# Patient Record
Sex: Female | Born: 1970 | Race: Black or African American | Hispanic: No | Marital: Married | Smoking: Never smoker
Health system: Southern US, Community
[De-identification: ages and names within clinical notes are randomized; demographics above are authoritative.]

## PROBLEM LIST (undated history)

## (undated) DIAGNOSIS — F329 Major depressive disorder, single episode, unspecified: Secondary | ICD-10-CM

## (undated) DIAGNOSIS — F32A Depression, unspecified: Secondary | ICD-10-CM

## (undated) DIAGNOSIS — F419 Anxiety disorder, unspecified: Secondary | ICD-10-CM

## (undated) DIAGNOSIS — I10 Essential (primary) hypertension: Secondary | ICD-10-CM

## (undated) HISTORY — PX: LAPAROSCOPIC GASTRIC SLEEVE RESECTION: SHX5895

## (undated) HISTORY — PX: TUBAL LIGATION: SHX77

## (undated) HISTORY — PX: OTHER SURGICAL HISTORY: SHX169

## (undated) HISTORY — PX: ABDOMINAL HYSTERECTOMY: SHX81

---

## 2010-11-20 ENCOUNTER — Emergency Department (HOSPITAL_BASED_OUTPATIENT_CLINIC_OR_DEPARTMENT_OTHER)
Admission: EM | Admit: 2010-11-20 | Discharge: 2010-11-20 | Disposition: A | Discharge status: Home / Self Care | Payer: BC Managed Care – PPO | Attending: Emergency Medicine | Admitting: Emergency Medicine

## 2010-11-20 ENCOUNTER — Encounter: Payer: Self-pay | Admitting: *Deleted

## 2010-11-20 ENCOUNTER — Emergency Department (INDEPENDENT_AMBULATORY_CARE_PROVIDER_SITE_OTHER): Payer: BC Managed Care – PPO

## 2010-11-20 DIAGNOSIS — J40 Bronchitis, not specified as acute or chronic: Secondary | ICD-10-CM

## 2010-11-20 DIAGNOSIS — R05 Cough: Secondary | ICD-10-CM

## 2010-11-20 DIAGNOSIS — F411 Generalized anxiety disorder: Secondary | ICD-10-CM | POA: Insufficient documentation

## 2010-11-20 DIAGNOSIS — R0989 Other specified symptoms and signs involving the circulatory and respiratory systems: Secondary | ICD-10-CM

## 2010-11-20 DIAGNOSIS — J3489 Other specified disorders of nose and nasal sinuses: Secondary | ICD-10-CM | POA: Insufficient documentation

## 2010-11-20 DIAGNOSIS — R059 Cough, unspecified: Secondary | ICD-10-CM | POA: Insufficient documentation

## 2010-11-20 DIAGNOSIS — I10 Essential (primary) hypertension: Secondary | ICD-10-CM | POA: Insufficient documentation

## 2010-11-20 HISTORY — DX: Essential (primary) hypertension: I10

## 2010-11-20 HISTORY — DX: Anxiety disorder, unspecified: F41.9

## 2010-11-20 MED ORDER — ALBUTEROL SULFATE HFA 108 (90 BASE) MCG/ACT IN AERS: 1.0000 | INHALATION_SPRAY | Freq: Four times a day (QID) | RESPIRATORY_TRACT | Status: DC | PRN

## 2010-11-20 MED ORDER — PREDNISONE 50 MG PO TABS
60.0000 mg | ORAL_TABLET | Freq: Once | ORAL | Status: AC
  Administered 2010-11-20: 60 mg via ORAL
  Filled 2010-11-20: qty 1

## 2010-11-20 MED ORDER — PREDNISONE 10 MG PO TABS: 20.0000 mg | ORAL_TABLET | Freq: Every day | ORAL | Status: AC

## 2010-11-20 NOTE — ED Provider Notes (Signed)
History     CSN: [id number] Arrival date & time: 11/20/2010  1:45 PM   First MD Initiated Contact with Patient 11/20/10 1352      Chief Complaint  Patient presents with  . Nasal Congestion  . Cough    (Consider location/radiation/quality/duration/timing/severity/associated sxs/prior treatment) Patient is a 40 y.o. female presenting with cough. The history is provided by the patient.  Cough This is a new problem. The current episode started more than 1 week ago. The problem occurs constantly. The problem has not changed since onset.The cough is non-productive. There has been no fever. Pertinent negatives include no chest pain, no chills, no sweats, no ear pain, no headaches, no sore throat, no myalgias, no shortness of breath, no wheezing and no eye redness. She is not a smoker. Her past medical history does not include bronchitis, pneumonia, COPD, emphysema or asthma.   Patient seen by her primary care provider one week ago and treated with an antibiotic Zithromax. This resulted in no changes in her cough.   Past Medical History  Diagnosis Date  . Hypertension   . Anxiety     Past Surgical History  Procedure Date  . C-section   . Tubal ligation     No family history on file.  History  Substance Use Topics  . Smoking status: Former Games developer  . Smokeless tobacco: Not on file  . Alcohol Use: No    OB History    Grav Para Term Preterm Abortions TAB SAB Ect Mult Living                  Review of Systems  Constitutional: Negative for chills.  HENT: Negative for ear pain and sore throat.   Eyes: Negative for redness.  Respiratory: Positive for cough. Negative for shortness of breath and wheezing.   Cardiovascular: Negative for chest pain.  Genitourinary: Negative for dysuria.  Musculoskeletal: Negative for myalgias and back pain.  Neurological: Negative for speech difficulty and headaches.    Allergies  Penicillins  Home Medications   Current Outpatient Rx    Name Route Sig Dispense Refill  . ALPRAZOLAM 0.5 MG PO TABS Oral Take 0.5 mg by mouth at bedtime as needed.      Marland Kitchen VALSARTAN-HYDROCHLOROTHIAZIDE 320-25 MG PO TABS Oral Take 1 tablet by mouth daily.        BP 109/79  Pulse 82  Temp(Src) 98.3 F (36.8 C) (Oral)  Resp 20  Ht 5\' 3"  (1.6 m)  Wt 270 lb (122.471 kg)  BMI 47.83 kg/m2  SpO2 96%  LMP 11/17/2010  Physical Exam  Nursing note and vitals reviewed. Constitutional: She is oriented to person, place, and time. She appears well-developed and well-nourished. No distress.  HENT:  Head: Normocephalic and atraumatic.  Mouth/Throat: Oropharynx is clear and moist.  Eyes: Conjunctivae and EOM are normal. Pupils are equal, round, and reactive to light.  Neck: Normal range of motion. Neck supple.  Cardiovascular: Normal rate, regular rhythm, normal heart sounds and intact distal pulses.   No murmur heard. Pulmonary/Chest: Effort normal and breath sounds normal. She has no wheezes. She has no rales. She exhibits no tenderness.  Abdominal: Soft. Bowel sounds are normal. There is no tenderness.  Musculoskeletal: Normal range of motion. She exhibits no edema and no tenderness.  Lymphadenopathy:    She has no cervical adenopathy.  Neurological: She is alert and oriented to person, place, and time. No cranial nerve deficit. She exhibits normal muscle tone. Coordination normal.  Skin: Skin is  warm. No rash noted. She is not diaphoretic.    ED Course  Procedures (including critical care time)  Labs Reviewed - No data to display Dg Chest 2 View  11/20/2010  *RADIOLOGY REPORT*  Clinical Data: Cough, congestion  CHEST - 2 VIEW  Comparison: None.  Findings: There is some linear scarring , atelectasis, less likely infiltrate in the left upper lobe.  Right lung clear. No effusion. Heart size normal.  Regional bones unremarkable.  IMPRESSION:  Left upper lobe linear scarring, atelectasis, or less likely infiltrate.  Original Report Authenticated By:  Osa Craver, M.D.     Impression: Viral bronchitis   MDM  Results a chest x-ray noted. Likely consistent with pneumonia. She has already had a course of antibiotic. Will treat patient for bronchitis. Suspect viral etiology. Treated with 60 mg prednisone in the emergency department. Syndrome of a continuing dose of prednisone for the next 5 days and albuterol inhaler. Inhalers not for wheezing but for the persistent cough.        Shelda Jakes, MD 11/20/10 [phone]

## 2010-11-20 NOTE — ED Notes (Signed)
Pt reports chest congestion and non-productive cough x 2 weeks- Was seen by PCP this week and put on z-pack- states not feeling any better

## 2012-06-30 DIAGNOSIS — I10 Essential (primary) hypertension: Secondary | ICD-10-CM | POA: Insufficient documentation

## 2015-01-06 DIAGNOSIS — J4520 Mild intermittent asthma, uncomplicated: Secondary | ICD-10-CM | POA: Insufficient documentation

## 2015-01-06 DIAGNOSIS — E669 Obesity, unspecified: Secondary | ICD-10-CM | POA: Insufficient documentation

## 2015-01-06 DIAGNOSIS — R000 Tachycardia, unspecified: Secondary | ICD-10-CM | POA: Insufficient documentation

## 2015-01-06 DIAGNOSIS — D509 Iron deficiency anemia, unspecified: Secondary | ICD-10-CM | POA: Insufficient documentation

## 2015-04-13 DIAGNOSIS — E6609 Other obesity due to excess calories: Secondary | ICD-10-CM | POA: Insufficient documentation

## 2015-05-18 ENCOUNTER — Emergency Department (HOSPITAL_BASED_OUTPATIENT_CLINIC_OR_DEPARTMENT_OTHER)
Admission: EM | Admit: 2015-05-18 | Discharge: 2015-05-18 | Disposition: A | Discharge status: Home / Self Care | Payer: No Typology Code available for payment source | Attending: Emergency Medicine | Admitting: Emergency Medicine

## 2015-05-18 ENCOUNTER — Encounter (HOSPITAL_BASED_OUTPATIENT_CLINIC_OR_DEPARTMENT_OTHER): Payer: Self-pay | Admitting: Emergency Medicine

## 2015-05-18 ENCOUNTER — Emergency Department (HOSPITAL_BASED_OUTPATIENT_CLINIC_OR_DEPARTMENT_OTHER): Payer: No Typology Code available for payment source

## 2015-05-18 DIAGNOSIS — I10 Essential (primary) hypertension: Secondary | ICD-10-CM | POA: Diagnosis not present

## 2015-05-18 DIAGNOSIS — S29001A Unspecified injury of muscle and tendon of front wall of thorax, initial encounter: Secondary | ICD-10-CM | POA: Insufficient documentation

## 2015-05-18 DIAGNOSIS — Y998 Other external cause status: Secondary | ICD-10-CM | POA: Insufficient documentation

## 2015-05-18 DIAGNOSIS — Y92410 Unspecified street and highway as the place of occurrence of the external cause: Secondary | ICD-10-CM | POA: Insufficient documentation

## 2015-05-18 DIAGNOSIS — Y9389 Activity, other specified: Secondary | ICD-10-CM | POA: Diagnosis not present

## 2015-05-18 DIAGNOSIS — S3991XA Unspecified injury of abdomen, initial encounter: Secondary | ICD-10-CM | POA: Insufficient documentation

## 2015-05-18 DIAGNOSIS — Z79899 Other long term (current) drug therapy: Secondary | ICD-10-CM | POA: Insufficient documentation

## 2015-05-18 DIAGNOSIS — S199XXA Unspecified injury of neck, initial encounter: Secondary | ICD-10-CM | POA: Insufficient documentation

## 2015-05-18 DIAGNOSIS — S3993XA Unspecified injury of pelvis, initial encounter: Secondary | ICD-10-CM | POA: Diagnosis not present

## 2015-05-18 DIAGNOSIS — S4991XA Unspecified injury of right shoulder and upper arm, initial encounter: Secondary | ICD-10-CM | POA: Diagnosis not present

## 2015-05-18 DIAGNOSIS — Z87891 Personal history of nicotine dependence: Secondary | ICD-10-CM | POA: Diagnosis not present

## 2015-05-18 DIAGNOSIS — Z880 Allergy status to penicillin: Secondary | ICD-10-CM | POA: Diagnosis not present

## 2015-05-18 DIAGNOSIS — F419 Anxiety disorder, unspecified: Secondary | ICD-10-CM | POA: Insufficient documentation

## 2015-05-18 LAB — CBC WITH DIFFERENTIAL/PLATELET
Basophils Absolute: 0 10*3/uL (ref 0.0–0.1)
Basophils Relative: 0 %
Eosinophils Absolute: 0.1 10*3/uL (ref 0.0–0.7)
Eosinophils Relative: 1 %
HEMATOCRIT: 34.3 % — AB (ref 36.0–46.0)
HEMOGLOBIN: 12.5 g/dL (ref 12.0–15.0)
LYMPHS ABS: 1.7 10*3/uL (ref 0.7–4.0)
LYMPHS PCT: 27 %
MCH: 26.4 pg (ref 26.0–34.0)
MCHC: 36.4 g/dL — ABNORMAL HIGH (ref 30.0–36.0)
MCV: 72.4 fL — AB (ref 78.0–100.0)
Monocytes Absolute: 0.5 10*3/uL (ref 0.1–1.0)
Monocytes Relative: 8 %
NEUTROS ABS: 4.1 10*3/uL (ref 1.7–7.7)
NEUTROS PCT: 64 %
Platelets: 233 10*3/uL (ref 150–400)
RBC: 4.74 MIL/uL (ref 3.87–5.11)
RDW: 15.9 % — ABNORMAL HIGH (ref 11.5–15.5)
WBC: 6.3 10*3/uL (ref 4.0–10.5)

## 2015-05-18 LAB — COMPREHENSIVE METABOLIC PANEL
ALK PHOS: 84 U/L (ref 38–126)
ALT: 9 U/L — AB (ref 14–54)
AST: 12 U/L — AB (ref 15–41)
Albumin: 3.8 g/dL (ref 3.5–5.0)
Anion gap: 12 (ref 5–15)
BUN: 10 mg/dL (ref 6–20)
CALCIUM: 9 mg/dL (ref 8.9–10.3)
CO2: 21 mmol/L — ABNORMAL LOW (ref 22–32)
CREATININE: 0.54 mg/dL (ref 0.44–1.00)
Chloride: 104 mmol/L (ref 101–111)
Glucose, Bld: 110 mg/dL — ABNORMAL HIGH (ref 65–99)
Potassium: 3.4 mmol/L — ABNORMAL LOW (ref 3.5–5.1)
Sodium: 137 mmol/L (ref 135–145)
Total Bilirubin: 0.4 mg/dL (ref 0.3–1.2)
Total Protein: 7.3 g/dL (ref 6.5–8.1)

## 2015-05-18 LAB — URINALYSIS, ROUTINE W REFLEX MICROSCOPIC
Bilirubin Urine: NEGATIVE
Glucose, UA: NEGATIVE mg/dL
Hgb urine dipstick: NEGATIVE
Ketones, ur: NEGATIVE mg/dL
Leukocytes, UA: NEGATIVE
Nitrite: NEGATIVE
Protein, ur: NEGATIVE mg/dL
Specific Gravity, Urine: 1.016 (ref 1.005–1.030)
pH: 6 (ref 5.0–8.0)

## 2015-05-18 LAB — RAPID URINE DRUG SCREEN, HOSP PERFORMED
Amphetamines: NOT DETECTED
BARBITURATES: NOT DETECTED
BENZODIAZEPINES: NOT DETECTED
COCAINE: NOT DETECTED
OPIATES: NOT DETECTED
Tetrahydrocannabinol: NOT DETECTED

## 2015-05-18 LAB — LIPASE, BLOOD: LIPASE: 42 U/L (ref 11–51)

## 2015-05-18 LAB — ETHANOL: Alcohol, Ethyl (B): <5 mg/dL

## 2015-05-18 MED ORDER — IOPAMIDOL (ISOVUE-300) INJECTION 61%
100.0000 mL | Freq: Once | INTRAVENOUS | Status: AC | PRN
  Administered 2015-05-18: 100 mL via INTRAVENOUS

## 2015-05-18 MED ORDER — SODIUM CHLORIDE 0.9 % IV BOLUS (SEPSIS)
1000.0000 mL | Freq: Once | INTRAVENOUS | Status: AC
  Administered 2015-05-18: 1000 mL via INTRAVENOUS

## 2015-05-18 MED ORDER — FENTANYL CITRATE (PF) 100 MCG/2ML IJ SOLN
50.0000 ug | Freq: Once | INTRAMUSCULAR | Status: AC
  Administered 2015-05-18: 50 ug via INTRAVENOUS
  Filled 2015-05-18: qty 2

## 2015-05-18 NOTE — ED Notes (Signed)
Patient transported to CT 
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
## 2015-05-18 NOTE — ED Notes (Signed)
Pt requested a work note. Dr Mora Bellmanni aware. States he has talked with pt about this and she is able to go to work today. Pt made aware.
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
## 2015-06-03 DIAGNOSIS — R9431 Abnormal electrocardiogram [ECG] [EKG]: Secondary | ICD-10-CM | POA: Insufficient documentation

## 2015-06-03 DIAGNOSIS — E559 Vitamin D deficiency, unspecified: Secondary | ICD-10-CM | POA: Insufficient documentation

## 2015-06-12 DIAGNOSIS — R002 Palpitations: Secondary | ICD-10-CM | POA: Insufficient documentation

## 2015-07-09 DIAGNOSIS — M2550 Pain in unspecified joint: Secondary | ICD-10-CM | POA: Insufficient documentation

## 2015-12-16 DIAGNOSIS — G8929 Other chronic pain: Secondary | ICD-10-CM | POA: Insufficient documentation

## 2015-12-16 DIAGNOSIS — M5432 Sciatica, left side: Secondary | ICD-10-CM | POA: Insufficient documentation

## 2016-03-10 ENCOUNTER — Ambulatory Visit: Payer: BC Managed Care – PPO | Admitting: Dietician

## 2017-01-25 DIAGNOSIS — N12 Tubulo-interstitial nephritis, not specified as acute or chronic: Secondary | ICD-10-CM | POA: Insufficient documentation

## 2017-01-30 DIAGNOSIS — A4150 Gram-negative sepsis, unspecified: Secondary | ICD-10-CM | POA: Insufficient documentation

## 2017-04-27 ENCOUNTER — Encounter (HOSPITAL_BASED_OUTPATIENT_CLINIC_OR_DEPARTMENT_OTHER): Payer: Self-pay | Admitting: *Deleted

## 2017-04-27 ENCOUNTER — Other Ambulatory Visit: Payer: Self-pay

## 2017-04-27 ENCOUNTER — Emergency Department (HOSPITAL_BASED_OUTPATIENT_CLINIC_OR_DEPARTMENT_OTHER)
Admission: EM | Admit: 2017-04-27 | Discharge: 2017-04-27 | Disposition: A | Discharge status: Home / Self Care | Payer: BC Managed Care – PPO | Attending: Emergency Medicine | Admitting: Emergency Medicine

## 2017-04-27 DIAGNOSIS — Z79899 Other long term (current) drug therapy: Secondary | ICD-10-CM | POA: Diagnosis not present

## 2017-04-27 DIAGNOSIS — Z87891 Personal history of nicotine dependence: Secondary | ICD-10-CM | POA: Insufficient documentation

## 2017-04-27 DIAGNOSIS — I10 Essential (primary) hypertension: Secondary | ICD-10-CM | POA: Diagnosis not present

## 2017-04-27 DIAGNOSIS — J069 Acute upper respiratory infection, unspecified: Secondary | ICD-10-CM | POA: Insufficient documentation

## 2017-04-27 DIAGNOSIS — R51 Headache: Secondary | ICD-10-CM | POA: Diagnosis present

## 2017-04-27 MED ORDER — FLUTICASONE PROPIONATE 50 MCG/ACT NA SUSP: 1.0000 | Freq: Every day | NASAL | 2 refills | Status: DC

## 2017-04-27 MED ORDER — IBUPROFEN 800 MG PO TABS: 800.0000 mg | ORAL_TABLET | Freq: Three times a day (TID) | ORAL | 0 refills | Status: DC | PRN
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
## 2017-04-27 NOTE — ED Provider Notes (Signed)
[facility] EMERGENCY DEPARTMENT Provider Note   CSN: [id number] Arrival date & time: 04/27/17  2231     History   Chief Complaint Chief Complaint  Patient presents with  . Hypertension  . Facial Pain    HPI Kaylee Smith is a 47 y.o. female with a hx of HTN and anxiety who presents to the ED with concern for sinus infection and hypertension today. Patient states she developed congestion, rhinorrhea, bilateral ear pressure, sinus pressure/pain, and sore throat today. States she tried sinus alkaseltzer without relief. No other specific alleviating/aggravating factors. Patient states she also had concern for possible elevated blood pressure after eating barbeque today, relays she felt a bit nauseous afterwards and thought her blood pressure may be high. She has been taking her antihypertensive medication as prescribed. Denies change in vision, numbness weakness, headaches, chest pain, or dyspnea. She also denies any fever or chills.   HPI  Past Medical History:  Diagnosis Date  . Anxiety   . Hypertension     There are no active problems to display for this patient.   Past Surgical History:  Procedure Laterality Date  . ABDOMINAL HYSTERECTOMY    . c-section    . TUBAL LIGATION       OB History   None      Home Medications    Prior to Admission medications   Medication Sig Start Date End Date Taking? Authorizing Provider  albuterol (PROVENTIL HFA;VENTOLIN HFA) 108 (90 BASE) MCG/ACT inhaler Inhale 1-2 puffs into the lungs every 6 (six) hours as needed for wheezing. 11/20/10 11/20/11  Vanetta MuldersZackowski, Scott, MD  ALPRAZolam Prudy Feeler(XANAX) 0.5 MG tablet Take 0.5 mg by mouth at bedtime as needed.      [provider]  clonazePAM (KLONOPIN) 0.5 MG tablet Take 0.5 mg by mouth 2 (two) times daily as needed for anxiety.    [provider]  valsartan-hydrochlorothiazide (DIOVAN-HCT) 320-25 MG per tablet Take 1 tablet by mouth daily.      [provider]     Family History History reviewed. No pertinent family history.  Social History Social History   Tobacco Use  . Smoking status: Former Games developermoker  . Smokeless tobacco: Never Used  Substance Use Topics  . Alcohol use: No  . Drug use: No     Allergies   Penicillins   Review of Systems Review of Systems  Constitutional: Negative for chills and fever.  HENT: Positive for congestion, ear pain, sinus pressure, sinus pain and sore throat. Negative for tinnitus, trouble swallowing and voice change.   Eyes: Negative for visual disturbance.  Respiratory: Negative for cough and shortness of breath.   Cardiovascular: Negative for chest pain.  Gastrointestinal: Positive for nausea (resolved at present). Negative for abdominal pain.  Neurological: Negative for dizziness, syncope, facial asymmetry, speech difficulty, weakness, light-headedness and numbness.     Physical Exam Updated Vital Signs BP (!) 144/100 (BP Location: Right Arm)   Pulse 92   Temp 98.1 F (36.7 C) (Oral)   Resp 20   Ht 5\' 5"  (1.651 m)   Wt 126.1 kg (278 lb)   LMP 11/17/2010   SpO2 97%   BMI 46.26 kg/m   Physical Exam  Constitutional: She appears well-developed and well-nourished.  Non-toxic appearance. No distress.  HENT:  Head: Normocephalic and atraumatic.  Right Ear: Tympanic membrane is not perforated, not erythematous, not retracted and not bulging.  Left Ear: Tympanic membrane is not perforated, not erythematous, not retracted and not bulging.  Nose:  Mucosal edema (congestion) present. Right sinus exhibits maxillary sinus tenderness (mild) and frontal sinus tenderness (mild). Left sinus exhibits maxillary sinus tenderness (mild) and frontal sinus tenderness (mild).  Mouth/Throat: Uvula is midline and oropharynx is clear and moist. No oropharyngeal exudate or posterior oropharyngeal erythema.  Eyes: Pupils are equal, round, and reactive to light. Conjunctivae and EOM are normal. Right eye exhibits no  discharge. Left eye exhibits no discharge.  Neck: Normal range of motion. Neck supple.  Cardiovascular: Normal rate and regular rhythm.  No murmur heard. Pulses:      Radial pulses are 2+ on the right side, and 2+ on the left side.  Pulmonary/Chest: Breath sounds normal. No respiratory distress. She has no wheezes. She has no rales.  Abdominal: Soft. She exhibits no distension. There is no tenderness.  Lymphadenopathy:    She has no cervical adenopathy.  Neurological: She is alert.  Clear Speech. No facial droop. CN III-XII grossly intact. Sensation grossly intact to bilateral upper/lower extremities. 5/5 grip strength. 5/5 strength with plantar/dorsiflexion bilaterally. Steady gait.   Skin: Skin is warm and dry. No rash noted.  Psychiatric: She has a normal mood and affect. Her behavior is normal.  Nursing note and vitals reviewed.   ED Treatments / Results  Labs (all labs ordered are listed, but only abnormal results are displayed) Labs Reviewed - No data to display  EKG None  Radiology No results found.  Procedures Procedures (including critical care time)  Medications Ordered in ED Medications - No data to display   Initial Impression / Assessment and Plan / ED Course  I have reviewed the triage vital signs and the nursing notes.  Pertinent labs & imaging results that were available during my care of the patient were reviewed by me and considered in my medical decision making (see chart for details).   Patient presents with sxs consistent with viral URI. Patient is nontoxic appearing, in no apparent distress. Vitals are WNL with the exception of her elevated blood pressure at 144/100- she denies headache, change in vision, numbness, weakness, chest pain, dyspnea, dizziness, or lightheadedness therefore doubt hypertensive emergency, discussed with patient to continue her antihypertensive medication as prescribed with primary care recheck within 1 week for re-evaluation and  possible adjustment in her blood pressure medication. In regards to her concern for sinus infection- given patient is afebrile and her sxs have been ongoing < 7 days doubt acute bacterial sinusitis at this time. Her lungs are CTA, doubt pneumonia. Centor score 0 doubt strep pharyngitis. No evidence of AOM on exam. At this time suspect viral etiology, will treat supportively with flonase and ibuprofen. I discussed treatment plan, need for PCP follow-up, and return precautions with the patient. Provided opportunity for questions, patient confirmed understanding and is in agreement with plan.   Final Clinical Impressions(s) / ED Diagnoses   Final diagnoses:  Viral URI  Essential hypertension    ED Discharge Orders        Ordered    fluticasone (FLONASE) 50 MCG/ACT nasal spray  Daily     04/27/17 2319    ibuprofen (ADVIL,MOTRIN) 800 MG tablet  Every 8 hours PRN     04/27/17 2319       Raynee Mccasland, Pleas Koch, PA-C 04/27/17 2329    Molpus, Jonny Ruiz, MD 04/28/17 0425

## 2017-04-27 NOTE — Discharge Instructions (Signed)
You were seen in the emergency department today for symptoms consistent with an viral upper respiratory infection. Given your symptoms started today we do not suspect this to be a bacterial infection at this time. For this reason we are treating you supportively with flonase and ibuprofen. Flonase is a intra-nasal steroid that you may spray once into each nostril daily. Ibuprofen is an anti-inflammatory medication to help with pain/swelling- take this every 8 hours with food. Do not take other NSAIDs (motrin, ibuprofen, aleve, advil, mobic,naproxen) with this medication as they are similar medications. You may take tylenol per over the counter dosing to supplement for discomfort. We have prescribed you new medication(s) today. Discuss the medications prescribed today with your pharmacist as they can have adverse effects and interactions with your other medicines including over the counter and prescribed medications. Seek medical evaluation if you start to experience new or abnormal symptoms after taking one of these medicines, seek care immediately if you start to experience difficulty breathing, feeling of your throat closing, facial swelling, or rash as these could be indications of a more serious allergic reaction.  Your blood pressure is elevated in the emergency department today.  Vitals:   04/27/17 2243  BP: (!) 144/100  Pulse: 92  Resp: 20  Temp: 98.1 F (36.7 C)  SpO2: 97%    Please scheduled an appointment with your primary care doctor on Monday or Tuesday this upcoming week for a recheck of your blood pressure to determine if you need an adjustment in your blood pressure medication and to recheck your overall symptoms. Return to the ER for any new or worsening symptoms including, but not limited to fever (temp >100.4), change in vision, headache, numbness, weakness, chest pain, shortness of breath, dizziness, or any other concerns you may have.

## 2017-04-28 DIAGNOSIS — M1612 Unilateral primary osteoarthritis, left hip: Secondary | ICD-10-CM | POA: Insufficient documentation

## 2017-06-24 DIAGNOSIS — M47816 Spondylosis without myelopathy or radiculopathy, lumbar region: Secondary | ICD-10-CM | POA: Insufficient documentation

## 2017-06-24 DIAGNOSIS — G894 Chronic pain syndrome: Secondary | ICD-10-CM | POA: Insufficient documentation

## 2017-06-24 DIAGNOSIS — Z79891 Long term (current) use of opiate analgesic: Secondary | ICD-10-CM | POA: Insufficient documentation

## 2017-07-10 ENCOUNTER — Encounter (HOSPITAL_BASED_OUTPATIENT_CLINIC_OR_DEPARTMENT_OTHER): Payer: Self-pay | Admitting: Emergency Medicine

## 2017-07-10 ENCOUNTER — Emergency Department (HOSPITAL_BASED_OUTPATIENT_CLINIC_OR_DEPARTMENT_OTHER)
Admission: EM | Admit: 2017-07-10 | Discharge: 2017-07-10 | Disposition: A | Discharge status: Home / Self Care | Payer: BC Managed Care – PPO | Attending: Emergency Medicine | Admitting: Emergency Medicine

## 2017-07-10 ENCOUNTER — Other Ambulatory Visit: Payer: Self-pay

## 2017-07-10 DIAGNOSIS — G4733 Obstructive sleep apnea (adult) (pediatric): Secondary | ICD-10-CM | POA: Insufficient documentation

## 2017-07-10 DIAGNOSIS — I10 Essential (primary) hypertension: Secondary | ICD-10-CM | POA: Insufficient documentation

## 2017-07-10 DIAGNOSIS — Z91040 Latex allergy status: Secondary | ICD-10-CM | POA: Insufficient documentation

## 2017-07-10 DIAGNOSIS — Z87891 Personal history of nicotine dependence: Secondary | ICD-10-CM | POA: Diagnosis not present

## 2017-07-10 DIAGNOSIS — R0602 Shortness of breath: Secondary | ICD-10-CM | POA: Diagnosis present

## 2017-07-10 DIAGNOSIS — Z79899 Other long term (current) drug therapy: Secondary | ICD-10-CM | POA: Insufficient documentation

## 2017-07-10 NOTE — ED Provider Notes (Signed)
[facility] Provider Note: Lowella DellJ. Lane Tashia Leiterman, MD, FACEP  CSN: [id number] MRN: [id number] ARRIVAL: 07/10/17 at 0301 ROOM: [facility]12/[facility]   CHIEF COMPLAINT  Shortness of Breath   HISTORY OF PRESENT ILLNESS  07/10/17 4:00 AM [name] is a 47 y.o. female with a history of morbid obesity scheduled for bariatric surgery in the near future.  She is here complaining of difficulty sleeping the past several nights.  The difficulty is that she feels her self gasping for air.  This only happens when lying in bed.  She denies sore throat or recent cold symptoms.  She is not known to be a loud snorer.  She does not have the symptoms when awake.  She is having daytime sleepiness.  She is not sure of anything that makes the symptoms better or worse.   Past Medical History:  Diagnosis Date  . Anxiety   . Hypertension     Past Surgical History:  Procedure Laterality Date  . ABDOMINAL HYSTERECTOMY    . c-section    . TUBAL LIGATION      No family history on file.  Social History   Tobacco Use  . Smoking status: Former Games developermoker  . Smokeless tobacco: Never Used  Substance Use Topics  . Alcohol use: No  . Drug use: No    Prior to Admission medications   Medication Sig Start Date End Date Taking? Authorizing Provider  albuterol (PROVENTIL HFA;VENTOLIN HFA) 108 (90 BASE) MCG/ACT inhaler Inhale 1-2 puffs into the lungs every 6 (six) hours as needed for wheezing. 11/20/10 11/20/11  Vanetta MuldersZackowski, Scott, MD  ALPRAZolam Prudy Feeler(XANAX) 0.5 MG tablet Take 0.5 mg by mouth at bedtime as needed.      [provider]  clonazePAM (KLONOPIN) 0.5 MG tablet Take 0.5 mg by mouth 2 (two) times daily as needed for anxiety.    [provider]  fluticasone (FLONASE) 50 MCG/ACT nasal spray Place 1 spray into both nostrils daily. 04/27/17   Petrucelli, Samantha R, PA-C  ibuprofen (ADVIL,MOTRIN) 800 MG tablet Take 1 tablet (800 mg total) by mouth every 8 (eight) hours as needed. 04/27/17   Petrucelli,  Samantha R, PA-C  valsartan-hydrochlorothiazide (DIOVAN-HCT) 320-25 MG per tablet Take 1 tablet by mouth daily.      [provider]    Allergies Latex and Penicillins   REVIEW OF SYSTEMS  Negative except as noted here or in the History of Present Illness.   PHYSICAL EXAMINATION  Initial Vital Signs Blood pressure 126/85, pulse 87, temperature 98.1 F (36.7 C), temperature source Oral, resp. rate 19, height 5\' 3"  (1.6 m), weight 129.7 kg (286 lb), last menstrual period 11/17/2010, SpO2 97 %.  Examination General: Well-developed, obese female in no acute distress; appearance consistent with age of record HENT: normocephalic; atraumatic; normal pharynx; no stridor; no dysphonia Eyes: pupils equal, round and reactive to light; extraocular muscles intact Neck: supple Heart: regular rate and rhythm Lungs: clear to auscultation bilaterally Abdomen: soft; obese; nontender; bowel sounds present Extremities: No deformity; full range of motion; pulses normal Neurologic: Awake, alert and oriented; motor function intact in all extremities and symmetric; no facial droop Skin: Warm and dry Psychiatric: Normal mood and affect   RESULTS  Summary of this visit's results, reviewed by myself:   EKG Interpretation  Date/Time:    Ventricular Rate:    PR Interval:    QRS Duration:   QT Interval:    QTC Calculation:   R Axis:     Text Interpretation:  Laboratory Studies: No results found for this or any previous visit (from the past 24 hour(s)). Imaging Studies: No results found.  ED COURSE and MDM  Nursing notes and initial vitals signs, including pulse oximetry, reviewed.  Vitals:   07/10/17 0308  BP: 126/85  Pulse: 87  Resp: 19  Temp: 98.1 F (36.7 C)  TempSrc: Oral  SpO2: 97%  Weight: 129.7 kg (286 lb)  Height: 5\' 3"  (1.6 m)   The patient's symptoms and body habitus are highly suspicious for obstructive sleep apnea.  She was advised to contact her  primary care physician for further evaluation.  We will also refer to [facility] pulmonology as they also diagnose and treat sleep apnea.  In the meantime she was advised to sleep sitting up such as with multiple pillows or in a recliner and to avoid sedating medications.  PROCEDURES    ED DIAGNOSES     ICD-10-CM   1. Obstructive sleep apnea syndrome G47.33        Khandi Kernes, Jonny Ruiz, MD 07/10/17 [phone]

## 2017-07-10 NOTE — ED Triage Notes (Signed)
Patient states woke up experiencing shortness of breath; states she felt like she couldn't get any air; states she went outside but this didn't relieve her sx; nad noted; also co left hip pain x 2 years.

## 2017-07-31 DIAGNOSIS — R7303 Prediabetes: Secondary | ICD-10-CM | POA: Insufficient documentation

## 2017-08-23 ENCOUNTER — Emergency Department (HOSPITAL_BASED_OUTPATIENT_CLINIC_OR_DEPARTMENT_OTHER): Payer: BC Managed Care – PPO

## 2017-08-23 ENCOUNTER — Other Ambulatory Visit: Payer: Self-pay

## 2017-08-23 ENCOUNTER — Emergency Department (HOSPITAL_BASED_OUTPATIENT_CLINIC_OR_DEPARTMENT_OTHER)
Admission: EM | Admit: 2017-08-23 | Discharge: 2017-08-23 | Disposition: A | Discharge status: Home / Self Care | Payer: BC Managed Care – PPO | Attending: Emergency Medicine | Admitting: Emergency Medicine

## 2017-08-23 DIAGNOSIS — Z79899 Other long term (current) drug therapy: Secondary | ICD-10-CM | POA: Diagnosis not present

## 2017-08-23 DIAGNOSIS — Z87891 Personal history of nicotine dependence: Secondary | ICD-10-CM | POA: Insufficient documentation

## 2017-08-23 DIAGNOSIS — Z91040 Latex allergy status: Secondary | ICD-10-CM | POA: Insufficient documentation

## 2017-08-23 DIAGNOSIS — I10 Essential (primary) hypertension: Secondary | ICD-10-CM | POA: Insufficient documentation

## 2017-08-23 DIAGNOSIS — M25552 Pain in left hip: Secondary | ICD-10-CM | POA: Insufficient documentation

## 2017-08-23 MED ORDER — KETOROLAC TROMETHAMINE 30 MG/ML IJ SOLN
30.0000 mg | Freq: Once | INTRAMUSCULAR | Status: AC
Start: 2017-08-23 — End: 2017-08-23
  Administered 2017-08-23: 30 mg via INTRAMUSCULAR

## 2017-08-23 MED ORDER — METHYLPREDNISOLONE 4 MG PO TBPK: ORAL_TABLET | ORAL | 0 refills | Status: DC

## 2017-08-23 NOTE — ED Triage Notes (Signed)
Pt states that she has a hx of OA and needs her hip replaced. But the pain she is experiencing tonight is different. Pt is c/o of sharp pain that is going down the front and back of her leg. Made worse with movement. Denies injury

## 2017-08-23 NOTE — ED Notes (Signed)
ED Provider at bedside. 

## 2017-08-23 NOTE — Discharge Instructions (Addendum)
Your seen today for right hip pain.  This may be related to your osteoarthritis.  You may also have some nerve impingement in your buttock.  Regardless, treatment is the same.  You will be given a Medrol dose pack for inflammation.  Follow-up with your doctor if symptoms persist.
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
## 2017-08-23 NOTE — ED Notes (Signed)
Patient transported to X-ray 

## 2017-09-18 DIAGNOSIS — G4733 Obstructive sleep apnea (adult) (pediatric): Secondary | ICD-10-CM | POA: Insufficient documentation

## 2017-09-18 DIAGNOSIS — J302 Other seasonal allergic rhinitis: Secondary | ICD-10-CM | POA: Insufficient documentation

## 2017-11-26 ENCOUNTER — Encounter (HOSPITAL_COMMUNITY): Payer: Self-pay | Admitting: Emergency Medicine

## 2017-11-26 ENCOUNTER — Emergency Department (HOSPITAL_COMMUNITY)
Admission: EM | Admit: 2017-11-26 | Discharge: 2017-11-26 | Disposition: A | Discharge status: Home / Self Care | Payer: BC Managed Care – PPO | Attending: Emergency Medicine | Admitting: Emergency Medicine

## 2017-11-26 ENCOUNTER — Other Ambulatory Visit: Payer: Self-pay

## 2017-11-26 DIAGNOSIS — F419 Anxiety disorder, unspecified: Secondary | ICD-10-CM

## 2017-11-26 DIAGNOSIS — E876 Hypokalemia: Secondary | ICD-10-CM

## 2017-11-26 DIAGNOSIS — I10 Essential (primary) hypertension: Secondary | ICD-10-CM | POA: Insufficient documentation

## 2017-11-26 DIAGNOSIS — Z87891 Personal history of nicotine dependence: Secondary | ICD-10-CM | POA: Insufficient documentation

## 2017-11-26 HISTORY — DX: Major depressive disorder, single episode, unspecified: F32.9

## 2017-11-26 HISTORY — DX: Depression, unspecified: F32.A

## 2017-11-26 LAB — CBC WITH DIFFERENTIAL/PLATELET
Abs Immature Granulocytes: 0.02 10*3/uL (ref 0.00–0.07)
Basophils Absolute: 0.1 10*3/uL (ref 0.0–0.1)
Basophils Relative: 1 %
EOS PCT: 1 %
Eosinophils Absolute: 0.1 10*3/uL (ref 0.0–0.5)
HEMATOCRIT: 39.9 % (ref 36.0–46.0)
Hemoglobin: 13.2 g/dL (ref 12.0–15.0)
Immature Granulocytes: 0 %
LYMPHS PCT: 27 %
Lymphs Abs: 2.8 10*3/uL (ref 0.7–4.0)
MCH: 24.9 pg — AB (ref 26.0–34.0)
MCHC: 33.1 g/dL (ref 30.0–36.0)
MCV: 75.3 fL — ABNORMAL LOW (ref 80.0–100.0)
MONO ABS: 0.9 10*3/uL (ref 0.1–1.0)
Monocytes Relative: 9 %
Neutro Abs: 6.5 10*3/uL (ref 1.7–7.7)
Neutrophils Relative %: 62 %
Platelets: 320 10*3/uL (ref 150–400)
RBC: 5.3 MIL/uL — AB (ref 3.87–5.11)
RDW: 14.5 % (ref 11.5–15.5)
WBC: 10.3 10*3/uL (ref 4.0–10.5)
nRBC: 0 % (ref 0.0–0.2)

## 2017-11-26 LAB — BASIC METABOLIC PANEL
Anion gap: 14 (ref 5–15)
CHLORIDE: 100 mmol/L (ref 98–111)
CO2: 24 mmol/L (ref 22–32)
CREATININE: 0.77 mg/dL (ref 0.44–1.00)
Calcium: 9.7 mg/dL (ref 8.9–10.3)
GFR calc Af Amer: >60 mL/min (ref >60)
Glucose, Bld: 101 mg/dL — ABNORMAL HIGH (ref 70–99)
Potassium: 2.7 mmol/L — CL (ref 3.5–5.1)
SODIUM: 138 mmol/L (ref 135–145)

## 2017-11-26 LAB — TSH: TSH: 1.895 u[IU]/mL (ref 0.350–4.500)

## 2017-11-26 MED ORDER — POTASSIUM CHLORIDE CRYS ER 20 MEQ PO TBCR: 40.0000 meq | EXTENDED_RELEASE_TABLET | Freq: Every day | ORAL | 0 refills | Status: AC

## 2017-11-26 MED ORDER — POTASSIUM CHLORIDE CRYS ER 20 MEQ PO TBCR
40.0000 meq | EXTENDED_RELEASE_TABLET | Freq: Once | ORAL | Status: AC
  Administered 2017-11-26: 40 meq via ORAL
  Filled 2017-11-26: qty 2

## 2017-11-26 MED ORDER — HYDROXYZINE HCL 25 MG PO TABS
25.0000 mg | ORAL_TABLET | Freq: Once | ORAL | Status: AC
  Administered 2017-11-26: 25 mg via ORAL
  Filled 2017-11-26: qty 1

## 2017-11-26 MED ORDER — HYDROXYZINE HCL 25 MG PO TABS: 25.0000 mg | ORAL_TABLET | Freq: Four times a day (QID) | ORAL | 0 refills | Status: DC | PRN

## 2017-11-26 NOTE — ED Provider Notes (Signed)
Emergency Department Provider Note   I have reviewed the triage vital signs and the nursing notes.   HISTORY  Chief Complaint Anxiety; Panic Attack; and Depression   HPI Kaylee Smith is a 47 y.o. female presents to the ED with increased anxiety and poor sleep. Patient notes anxiety symptoms worsening over the last weeks. She was started on Zoloft by here PCP and has been referred to a psychiatrist. She has been having racing thoughts at night especially which she describes and worrying about various problems with family or herself. Adamantly denies any thoughts of SI or HI. No auditory or visual hallucinations. No fever or chills. She does not drink alcohol and denies significant caffeine use.   Past Medical History:  Diagnosis Date  . Anxiety   . Depressed   . Hypertension     There are no active problems to display for this patient.   Past Surgical History:  Procedure Laterality Date  . ABDOMINAL HYSTERECTOMY    . c-section    . TUBAL LIGATION     Allergies Latex and Penicillins  No family history on file.  Social History Social History   Tobacco Use  . Smoking status: Former Games developer  . Smokeless tobacco: Never Used  Substance Use Topics  . Alcohol use: No  . Drug use: No    Review of Systems  Constitutional: No fever/chills Eyes: No visual changes. ENT: No sore throat. Cardiovascular: Denies chest pain. Respiratory: Denies shortness of breath. Gastrointestinal: No abdominal pain.  No nausea, no vomiting.  No diarrhea.  No constipation. Genitourinary: Negative for dysuria. Musculoskeletal: Negative for back pain. Skin: Negative for rash. Neurological: Negative for headaches, focal weakness or numbness. Psychiatric:Increased anxiety symptoms.   10-point ROS otherwise negative.  ____________________________________________   PHYSICAL EXAM:  VITAL SIGNS: ED Triage Vitals  Enc Vitals Group     BP 11/26/17 1615 (!) 126/97     Pulse Rate  11/26/17 1615 (!) 108     Resp 11/26/17 1615 16     Temp 11/26/17 1615 98.7 F (37.1 C)     Temp Source 11/26/17 1615 Oral     SpO2 11/26/17 1615 97 %     Weight 11/26/17 1615 287 lb (130.2 kg)     Height 11/26/17 1615 5\' 3"  (1.6 m)     Pain Score 11/26/17 1628 4   Constitutional: Alert and oriented. Well appearing and in no acute distress. Eyes: Conjunctivae are normal.  Head: Atraumatic. Nose: No congestion/rhinnorhea. Mouth/Throat: Mucous membranes are moist.  Neck: No stridor. Cardiovascular: Normal rate, regular rhythm. Good peripheral circulation. Grossly normal heart sounds.   Respiratory: Normal respiratory effort.  No retractions. Lungs CTAB. Gastrointestinal: Soft and nontender. No distention.  Musculoskeletal: No lower extremity tenderness nor edema. No gross deformities of extremities. Neurologic:  Normal speech and language. No gross focal neurologic deficits are appreciated.  Skin:  Skin is warm, dry and intact. No rash noted. Psychiatric: Mood and affect are normal. Speech and behavior are normal.  ____________________________________________   LABS (all labs ordered are listed, but only abnormal results are displayed)  Labs Reviewed  BASIC METABOLIC PANEL - Abnormal; Notable for the following components:      Result Value   Potassium 2.7 (*)    Glucose, Bld 101 (*)    BUN <5 (*)    All other components within normal limits  CBC WITH DIFFERENTIAL/PLATELET - Abnormal; Notable for the following components:   RBC 5.30 (*)    MCV 75.3 (*)  MCH 24.9 (*)    All other components within normal limits  TSH   ____________________________________________  EKG   EKG Interpretation  Date/Time:  Sunday November 26 2017 18:27:57 EST Ventricular Rate:  88 PR Interval:    QRS Duration: 98 QT Interval:  404 QTC Calculation: 489 R Axis:   -38 Text Interpretation:  Sinus rhythm Anterolateral infarct, old No STEMI.  Confirmed by Alona Bene [phone]) on 11/26/2017  6:38:44 PM Also confirmed by Alona Bene [phone]), editor Elita Quick ([id number])  on 11/27/2017 7:48:33 AM       ____________________________________________  RADIOLOGY  None ____________________________________________   PROCEDURES  Procedure(s) performed:   Procedures  None ____________________________________________   INITIAL IMPRESSION / ASSESSMENT AND PLAN / ED COURSE  Pertinent labs & imaging results that were available during my care of the patient were reviewed by me and considered in my medical decision making (see chart for details).  Patient presents to the ED with increased anxiety symptoms and poor sleep. Patient is calm and cooperative. Speech is not pressured. Does not appear to be experiencing acute mania. No SI/HI. Labs and TSH reviewed. Will give Atarax and advised psychiatry follow up for medication maintenance.   At this time, I do not feel there is any life-threatening condition present. I have reviewed and discussed all results (EKG, imaging, lab, urine as appropriate), exam findings with patient. I have reviewed nursing notes and appropriate previous records.  I feel the patient is safe to be discharged home without further emergent workup. Discussed usual and customary return precautions. Patient and family (if present) verbalize understanding and are comfortable with this plan.  Patient will follow-up with their primary care provider. If they do not have a primary care provider, information for follow-up has been provided to them. All questions have been answered.  ____________________________________________  FINAL CLINICAL IMPRESSION(S) / ED DIAGNOSES  Final diagnoses:  Anxiety  Hypokalemia     MEDICATIONS GIVEN DURING THIS VISIT:  Medications  hydrOXYzine (ATARAX/VISTARIL) tablet 25 mg (25 mg Oral Given 11/26/17 1717)  potassium chloride SA (K-DUR,KLOR-CON) CR tablet 40 mEq (40 mEq Oral Given 11/26/17 1836)     NEW OUTPATIENT  MEDICATIONS STARTED DURING THIS VISIT:  Discharge Medication List as of 11/26/2017  7:03 PM    START taking these medications   Details  hydrOXYzine (ATARAX/VISTARIL) 25 MG tablet Take 1 tablet (25 mg total) by mouth every 6 (six) hours as needed for anxiety., Starting Sun 11/26/2017, Print        Note:  This document was prepared using Dragon voice recognition software and may include unintentional dictation errors.  Alona Bene, MD Emergency Medicine    Long, Arlyss Repress, MD 11/27/17 [phone]

## 2017-11-26 NOTE — ED Notes (Signed)
Pt requesting evaluation for sinus infection states her mother thinks her face is swollen.  Dr. Jacqulyn Bath aware

## 2017-11-26 NOTE — ED Triage Notes (Addendum)
Pt takes she was prescribed a new med for anxiety and depression that she has been taking since Thursday. Pt states this medication is not working yet. Pt states at night she is not able to calm her mind to go to bed. She requests meds to help her fall asleep and for her anxiety attacks to take as needed. Denies SI and denies HI. Pt states she is up all night until daylight, then sleeps in the morning. Pt only sleeps for like 15 minutes.

## 2017-11-26 NOTE — Discharge Instructions (Signed)
You were seen in the ED today with anxiety symptoms. We are starting some anxiety medication but you will need to follow up with your PCP and Psychiatrist in the coming week to make adjustments as needed. This medication may make you drowsy so do not drive a car while taking this.  Your potassium was also low. I am giving some potassium pills to take for the next several days. Call your PCP to re-check your potassium in the coming week.

## 2017-11-26 NOTE — ED Notes (Signed)
Pt stable, ambulatory, states understanding of discharge instructions 

## 2017-11-29 NOTE — ED Notes (Signed)
Called pt to let her know that her charger is here in ED, pt states that I should throw it away

## 2018-02-21 DIAGNOSIS — N951 Menopausal and female climacteric states: Secondary | ICD-10-CM | POA: Insufficient documentation

## 2018-04-24 ENCOUNTER — Ambulatory Visit: Payer: BC Managed Care – PPO | Admitting: Medical

## 2018-05-22 DIAGNOSIS — M8580 Other specified disorders of bone density and structure, unspecified site: Secondary | ICD-10-CM | POA: Insufficient documentation

## 2018-05-22 DIAGNOSIS — E782 Mixed hyperlipidemia: Secondary | ICD-10-CM | POA: Insufficient documentation

## 2018-05-22 DIAGNOSIS — F410 Panic disorder [episodic paroxysmal anxiety] without agoraphobia: Secondary | ICD-10-CM | POA: Insufficient documentation

## 2018-05-22 DIAGNOSIS — L732 Hidradenitis suppurativa: Secondary | ICD-10-CM | POA: Insufficient documentation

## 2018-06-07 DIAGNOSIS — F5104 Psychophysiologic insomnia: Secondary | ICD-10-CM | POA: Insufficient documentation

## 2018-06-22 DIAGNOSIS — M5030 Other cervical disc degeneration, unspecified cervical region: Secondary | ICD-10-CM | POA: Insufficient documentation

## 2018-06-22 DIAGNOSIS — M47812 Spondylosis without myelopathy or radiculopathy, cervical region: Secondary | ICD-10-CM | POA: Insufficient documentation

## 2018-07-04 DIAGNOSIS — F411 Generalized anxiety disorder: Secondary | ICD-10-CM | POA: Insufficient documentation

## 2018-07-04 DIAGNOSIS — F3289 Other specified depressive episodes: Secondary | ICD-10-CM | POA: Insufficient documentation

## 2018-09-10 DIAGNOSIS — M5136 Other intervertebral disc degeneration, lumbar region: Secondary | ICD-10-CM | POA: Insufficient documentation

## 2018-11-21 ENCOUNTER — Emergency Department (HOSPITAL_COMMUNITY)
Admission: EM | Admit: 2018-11-21 | Discharge: 2018-11-22 | Disposition: A | Discharge status: Home / Self Care | Payer: BLUE CROSS/BLUE SHIELD | Attending: Emergency Medicine | Admitting: Emergency Medicine

## 2018-11-21 ENCOUNTER — Encounter (HOSPITAL_COMMUNITY): Payer: Self-pay

## 2018-11-21 ENCOUNTER — Other Ambulatory Visit: Payer: Self-pay

## 2018-11-21 DIAGNOSIS — Z87891 Personal history of nicotine dependence: Secondary | ICD-10-CM | POA: Diagnosis not present

## 2018-11-21 DIAGNOSIS — Z046 Encounter for general psychiatric examination, requested by authority: Secondary | ICD-10-CM | POA: Diagnosis present

## 2018-11-21 DIAGNOSIS — Z91040 Latex allergy status: Secondary | ICD-10-CM | POA: Insufficient documentation

## 2018-11-21 DIAGNOSIS — F411 Generalized anxiety disorder: Secondary | ICD-10-CM | POA: Diagnosis not present

## 2018-11-21 DIAGNOSIS — Z79899 Other long term (current) drug therapy: Secondary | ICD-10-CM | POA: Insufficient documentation

## 2018-11-21 DIAGNOSIS — F329 Major depressive disorder, single episode, unspecified: Secondary | ICD-10-CM | POA: Insufficient documentation

## 2018-11-21 DIAGNOSIS — F419 Anxiety disorder, unspecified: Secondary | ICD-10-CM

## 2018-11-21 LAB — COMPREHENSIVE METABOLIC PANEL
ALT: 12 U/L (ref 0–44)
AST: 12 U/L — ABNORMAL LOW (ref 15–41)
Albumin: 4.5 g/dL (ref 3.5–5.0)
Alkaline Phosphatase: 98 U/L (ref 38–126)
Anion gap: 12 (ref 5–15)
BUN: 9 mg/dL (ref 6–20)
CO2: 25 mmol/L (ref 22–32)
Calcium: 9.8 mg/dL (ref 8.9–10.3)
Chloride: 100 mmol/L (ref 98–111)
Creatinine, Ser: 0.72 mg/dL (ref 0.44–1.00)
GFR calc Af Amer: >60 mL/min (ref >60)
GFR calc non Af Amer: >60 mL/min (ref >60)
Glucose, Bld: 88 mg/dL (ref 70–99)
Potassium: 3.4 mmol/L — ABNORMAL LOW (ref 3.5–5.1)
Sodium: 137 mmol/L (ref 135–145)
Total Bilirubin: 0.6 mg/dL (ref 0.3–1.2)
Total Protein: 7.9 g/dL (ref 6.5–8.1)

## 2018-11-21 LAB — CBC
HCT: 40.6 % (ref 36.0–46.0)
Hemoglobin: 14.1 g/dL (ref 12.0–15.0)
MCH: 26.8 pg (ref 26.0–34.0)
MCHC: 34.7 g/dL (ref 30.0–36.0)
MCV: 77 fL — ABNORMAL LOW (ref 80.0–100.0)
Platelets: 249 10*3/uL (ref 150–400)
RBC: 5.27 MIL/uL — ABNORMAL HIGH (ref 3.87–5.11)
RDW: 13.4 % (ref 11.5–15.5)
WBC: 7 10*3/uL (ref 4.0–10.5)
nRBC: 0 % (ref 0.0–0.2)

## 2018-11-21 LAB — ETHANOL: Alcohol, Ethyl (B): <10 mg/dL (ref <10)

## 2018-11-21 LAB — SALICYLATE LEVEL: Salicylate Lvl: <7 mg/dL (ref 2.8–30.0)

## 2018-11-21 LAB — ACETAMINOPHEN LEVEL: Acetaminophen (Tylenol), Serum: <10 ug/mL — ABNORMAL LOW (ref 10–30)

## 2018-11-21 NOTE — ED Triage Notes (Signed)
Pt crying in triage.  States she has started new medications within the past 1-2 months, Zoloft and Wellbutrin but they are not helping.  Pt states she is worried, scared to stay home alone, and feels like something bad will happen if medications are not changed.  Denies SI/HI.

## 2018-11-21 NOTE — BH Assessment (Signed)
Tele Assessment Note   Patient Name: Kaylee Smith MRN: [id number] Referring Physician: Gentry Fitz Location of Patient: [facility] Location of Provider: Behavioral Health TTS Department  Jany Buckwalter is an 48 y.o. female.  -Clinician reviewed note by Farrel Demark, RN.  Pt crying in triage.  States she has started new medications within the past 1-2 months, Zoloft and Wellbutrin but they are not helping.  Pt states she is worried, scared to stay home alone, and feels like something bad will happen if medications are not changed.  Denies SI/HI.  Patient is tearful at times during assessment.  She said that she had been on 200mg  of Welbutrin.  She had some side effects so the dosage was reduced to 100mg  today.  She also takes zoloft too.  Patient does not feel that medication is helping w/ her anxiety.  She describes being fearful and lonely much of the time.  Her husband works early mornings and she has to take him to work.  Patient describes being tearful when she is by herself at home.  She said that she feels safe but is still anxious much of the time.  Patient denies any SI.  No current or recurrent plan or intention to kill herself.  She also denies any HI or A/V hallucinations.  Patient denies use of ETOH or illicit drugs.  Patient has good eye contact.  She is anxious during interview and her stated mood is congruent with presentation.  Her thought process is coherent and logical.  She is oriented x4 and evidences no internal stimuli response.  Patient has outpatient psychiatry through w/ [facility].  She has an appointment on Friday (10/30) with a new therapist.  Patient has no inpatient psychiatric history.  -Clinician discussed patient care with Tuesday, NP who did not recommend inpatient psychiatric care.  She recommended patient follow up with current outpatient provider.  Clinician did inform her of [facility] outpatient IOP.    Diagnosis: F41.1 Generalized  anxiety d/o  Past Medical History:  Past Medical History:  Diagnosis Date  . Anxiety   . Depressed   . Hypertension     Past Surgical History:  Procedure Laterality Date  . ABDOMINAL HYSTERECTOMY    . c-section    . TUBAL LIGATION      Family History: History reviewed. No pertinent family history.  Social History:  reports that she has quit smoking. She has never used smokeless tobacco. She reports that she does not drink alcohol or use drugs.  Additional Social History:  Alcohol / Drug Use Pain Medications: None Prescriptions: Welbutrin 100mg  1 in AM (started today, had been 200mg  when first started on 10/08); Zoloft 100mg  in AM; Potassium, Maloxicam, Hydroxyzine Over the Counter: Vitamin D & C; Excedrin migraine History of alcohol / drug use?: No history of alcohol / drug abuse  CIWA:   COWS:    Allergies:  Allergies  Allergen Reactions  . Latex Itching  . Penicillins Itching    Home Medications: (Not in a hospital admission)   OB/GYN Status:  Patient's last menstrual period was 11/17/2010.  General Assessment Data Location of Assessment: [facility] ED TTS Assessment: In system Is this a Tele or Face-to-Face Assessment?: Tele Assessment Is this an Initial Assessment or a Re-assessment for this encounter?: Initial Assessment Patient Accompanied by:: N/A Language Other than English: No Living Arrangements: Other (Comment)(Lives with husband.) What gender do you identify as?: Female Marital status: Married [place] name: Pregnancy Status: No  Living Arrangements: Spouse/significant other Can pt return to current living arrangement?: Yes Admission Status: Voluntary Is patient capable of signing voluntary admission?: Yes Referral Source: Self/Family/Friend(Pt drove herself.) Insurance type: BC/BS & MCD     Crisis Care Plan Living Arrangements: Spouse/significant other Name of Psychiatrist: Harvel Quale in [place] Name of Therapist: [facility]  outpatient  Education Status Is patient currently in school?: No Is the patient employed, unemployed or receiving disability?: Unemployed  Risk to self with the past 6 months Suicidal Ideation: No Has patient been a risk to self within the past 6 months prior to admission? : No Suicidal Intent: No Has patient had any suicidal intent within the past 6 months prior to admission? : No Is patient at risk for suicide?: No Suicidal Plan?: No Has patient had any suicidal plan within the past 6 months prior to admission? : No Access to Means: No What has been your use of drugs/alcohol within the last 12 months?: None Previous Attempts/Gestures: No How many times?: 0 Other Self Harm Risks: None Triggers for Past Attempts: None known Intentional Self Injurious Behavior: None Family Suicide History: No Recent stressful life event(s): Recent negative physical changes Persecutory voices/beliefs?: No Depression: Yes Depression Symptoms: Despondent, Tearfulness, Feeling worthless/self pity Substance abuse history and/or treatment for substance abuse?: No Suicide prevention information given to non-admitted patients: Not applicable  Risk to Others within the past 6 months Homicidal Ideation: No Does patient have any lifetime risk of violence toward others beyond the six months prior to admission? : No Thoughts of Harm to Others: No Current Homicidal Intent: No Current Homicidal Plan: No Access to Homicidal Means: No Identified Victim: No one History of harm to others?: No Assessment of Violence: None Noted Violent Behavior Description: None reported Does patient have access to weapons?: No Criminal Charges Pending?: No Does patient have a court date: No Is patient on probation?: No  Psychosis Hallucinations: None noted Delusions: None noted  Mental Status Report Appearance/Hygiene: Disheveled, Body odor Eye Contact: Good Motor Activity: Freedom of movement, Unremarkable Speech:  Logical/coherent Level of Consciousness: Alert, Crying Mood: Depressed, Anxious, Helpless, Sad Affect: Anxious, Depressed, Sad Anxiety Level: Panic Attacks Panic attack frequency: 3-4 times in a week Most recent panic attack: Today Thought Processes: Coherent, Relevant Judgement: Unimpaired Orientation: Person, Place, Situation, Time Obsessive Compulsive Thoughts/Behaviors: None  Cognitive Functioning Concentration: Decreased Memory: Remote Intact, Recent Impaired Is patient IDD: No Insight: Good Impulse Control: Fair Appetite: Fair Have you had any weight changes? : No Change Sleep: Decreased Total Hours of Sleep: (4 hours.  Up and down.) Vegetative Symptoms: Decreased grooming  ADLScreening [facility] Assessment Services) Patient's cognitive ability adequate to safely complete daily activities?: Yes Patient able to express need for assistance with ADLs?: Yes Independently performs ADLs?: Yes (appropriate for developmental age)  Prior Inpatient Therapy Prior Inpatient Therapy: No  Prior Outpatient Therapy Prior Outpatient Therapy: Yes Prior Therapy Dates: Current Prior Therapy Facilty/Provider(s): [facility] outpatient  Reason for Treatment: med management & counseling Does patient have an ACCT team?: No Does patient have Intensive In-House Services?  : No Does patient have [facility] services? : No Does patient have P4CC services?: No  ADL Screening (condition at time of admission) Patient's cognitive ability adequate to safely complete daily activities?: Yes Is the patient deaf or have difficulty hearing?: Yes(Sometimes hears a ringing.) Does the patient have difficulty seeing, even when wearing glasses/contacts?: No Does the patient have difficulty concentrating, remembering, or making decisions?: Yes(Difficulty remembering things.) Patient able to express need for  assistance with ADLs?: Yes Does the patient have difficulty dressing or bathing?: Yes(Has been having difficulty with  standing because of left hip.) Independently performs ADLs?: Yes (appropriate for developmental age) Does the patient have difficulty walking or climbing stairs?: Yes Weakness of Legs: Left(left hip problem) Weakness of Arms/Hands: Both(carpal tunnel in both hands)  Home Assistive Devices/Equipment Home Assistive Devices/Equipment: Cane (specify quad or straight)    Abuse/Neglect Assessment (Assessment to be complete while patient is alone) Abuse/Neglect Assessment Can Be Completed: Yes Physical Abuse: Denies Verbal Abuse: Denies Sexual Abuse: Denies Exploitation of patient/patient's resources: Denies Self-Neglect: Denies     Merchant navy officerAdvance Directives (For Healthcare) Does Patient Have a Medical Advance Directive?: No Would patient like information on creating a medical advance directive?: No - Patient declined          Disposition:  Disposition Initial Assessment Completed for this Encounter: Yes Patient referred to: Other (Comment)(Pt to follow up w/ current provider.)  This service was provided via telemedicine using a 2-way, interactive audio and video technology.  Names of all persons participating in this telemedicine service and their role in this encounter. Name: [name] Role: patient  Name: Beatriz StallionMarcus Javarri Segal, M.S. LCAS QP Role: clinician  Name:  Role:   Name:  Role:     Alexandria LodgeHarvey, Beyonca Wisz Ray 11/21/2018 8:18 PM

## 2018-11-22 MED ORDER — HYDROXYZINE HCL 25 MG PO TABS: 25.0000 mg | ORAL_TABLET | Freq: Four times a day (QID) | ORAL | 0 refills | Status: DC | PRN

## 2018-11-22 NOTE — Discharge Instructions (Addendum)
Please follow up with your mental health specialist next week for further management of your mental health.  Take vistaril as needed for anxiety.  You may stop taking Wellbutrin if you notice worsening rash, or discuss this with your doctor.

## 2018-11-22 NOTE — ED Provider Notes (Signed)
[facility] EMERGENCY [facility]PARTMENT Provider Note   CSN: [id number] Arrival date & time: 11/21/18  1720     History   Chief Complaint Chief Complaint  Patient presents with  . Psychiatric Evaluation    HPI Kaylee Smith is a 48 y.o. female.     The history is provided by the patient and medical records. No language interpreter was used.     48 year old female with history of depression, anxiety, hypertension, presented to ED for evaluation of depression.  Patient report she has had increased bouts of depression ongoing for the past 3 years after losing her job and having trouble with her health which includes a bad hip.  She mention started on new antidepressant medication which include Zoloft and Wellbutrin a little over a month ago.  She states the Wellbutrin seems to help with her weight gain however within the past week she noticed some irritative itchy rash that appears on her lip and face and was related to potential drug reaction.  She does not complain of any tongue swelling, trouble breathing, wheezing, or abdominal cramping.  She denies any other environmental changes.  She does not complain of any SI or HI, denies any auditory or visual hallucination and denies self-medicating with either alcohol or tobacco use.  She endorsed having multiple bouts of panic attack and having trouble sleeping at night.  She does have an appointment with mental health specialist next week.  She is here with concerns of her medication not working appropriately.  She does report good family support.  Past Medical History:  Diagnosis Date  . Anxiety   . Depressed   . Hypertension     There are no active problems to display for this patient.   Past Surgical History:  Procedure Laterality Date  . ABDOMINAL HYSTERECTOMY    . c-section    . TUBAL LIGATION       OB History   No obstetric history on file.      Home Medications    Prior to Admission medications    Medication Sig Start Date End Date Taking? Authorizing Provider  fluticasone (FLONASE) 50 MCG/ACT nasal spray Place 1 spray into both nostrils daily. Patient not taking: Reported on 11/26/2017 04/27/17   Petrucelli, Lelon MastSamantha R, PA-C  hydrOXYzine (ATARAX/VISTARIL) 25 MG tablet Take 1 tablet (25 mg total) by mouth every 6 (six) hours as needed for anxiety. 11/26/17   Long, Arlyss RepressJoshua G, MD  ibuprofen (ADVIL,MOTRIN) 800 MG tablet Take 1 tablet (800 mg total) by mouth every 8 (eight) hours as needed. Patient not taking: Reported on 11/26/2017 04/27/17   Petrucelli, Pleas KochSamantha R, PA-C  meloxicam (MOBIC) 15 MG tablet Take 15 mg by mouth daily as needed for muscle spasms. 10/31/17   [provider]  methylPREDNISolone (MEDROL DOSEPAK) 4 MG TBPK tablet Take as directed on packet Patient not taking: Reported on 11/26/2017 08/23/17   Horton, Mayer Maskerourtney F, MD  potassium chloride SA (K-DUR,KLOR-CON) 20 MEQ tablet Take 2 tablets (40 mEq total) by mouth daily for 5 days. 11/27/17 12/02/17  Maia PlanLong, Joshua G, MD  sertraline (ZOLOFT) 25 MG tablet Take 25 mg by mouth daily. 11/23/17   [provider]  valsartan-hydrochlorothiazide (DIOVAN-HCT) 160-25 MG tablet Take 1 tablet by mouth daily.    [provider]    Family History History reviewed. No pertinent family history.  Social History Social History   Tobacco Use  . Smoking status: Former Games developermoker  . Smokeless tobacco: Never Used  Substance Use  Topics  . Alcohol use: No  . Drug use: No     Allergies   Latex and Penicillins   Review of Systems Review of Systems  All other systems reviewed and are negative.    Physical Exam Updated Vital Signs BP 127/71 (BP Location: Left Arm)   Pulse 91   Temp 98.5 F (36.9 C) (Oral)   Resp 18   LMP 11/17/2010   SpO2 96%   Physical Exam Vitals signs and nursing note reviewed.  Constitutional:      General: She is not in acute distress.    Appearance: She is well-developed. She is obese.   HENT:     Head: Atraumatic.     Mouth/Throat:     Comments: 1 singular 1 mm ulcerated lesion noted to the mucosal region of the lower lip.  No tongue swelling, no stridor Eyes:     Conjunctiva/sclera: Conjunctivae normal.  Neck:     Musculoskeletal: Neck supple.  Cardiovascular:     Rate and Rhythm: Normal rate and regular rhythm.  Pulmonary:     Effort: Pulmonary effort is normal.     Breath sounds: Normal breath sounds.  Abdominal:     Palpations: Abdomen is soft.  Skin:    Findings: No rash (Several scattered maculopapular rash noted to the face without any signs of infection.).  Neurological:     Mental Status: She is alert and oriented to person, place, and time.  Psychiatric:        Attention and Perception: Attention normal.        Mood and Affect: Mood normal.        Speech: Speech normal.        Behavior: Behavior is cooperative.        Thought Content: Thought content does not include homicidal or suicidal ideation.        Cognition and Memory: Cognition normal.        Judgment: Judgment normal.      ED Treatments / Results  Labs (all labs ordered are listed, but only abnormal results are displayed) Labs Reviewed  COMPREHENSIVE METABOLIC PANEL - Abnormal; Notable for the following components:      Result Value   Potassium 3.4 (*)    AST 12 (*)    All other components within normal limits  ACETAMINOPHEN LEVEL - Abnormal; Notable for the following components:   Acetaminophen (Tylenol), Serum <10 (*)    All other components within normal limits  CBC - Abnormal; Notable for the following components:   RBC 5.27 (*)    MCV 77.0 (*)    All other components within normal limits  ETHANOL  SALICYLATE LEVEL  RAPID URINE DRUG SCREEN, HOSP PERFORMED    EKG None  Radiology No results found.  Procedures Procedures (including critical care time)  Medications Ordered in ED Medications - No data to display   Initial Impression / Assessment and Plan / ED  Course  I have reviewed the triage vital signs and the nursing notes.  Pertinent labs & imaging results that were available during my care of the patient were reviewed by me and considered in my medical decision making (see chart for details).        BP 127/71 (BP Location: Left Arm)   Pulse 91   Temp 98.5 F (36.9 C) (Oral)   Resp 18   LMP 11/17/2010   SpO2 96%    Final Clinical Impressions(s) / ED Diagnoses   Final diagnoses:  Anxiety  ED Discharge Orders         Ordered    hydrOXYzine (ATARAX/VISTARIL) 25 MG tablet  Every 6 hours PRN     11/22/18 0806         8:04 AM Patient report her depression is not well controlled with her current antidepressant medication.  She however denies SI or HI.  She endorsed some mild skin irritation and rash for the past week and believe it may be due to her Wellbutrin that she has been taking for the past month.  She does not have any signs of anaphylaxis.  Rash very nonspecific.  Encourage patient to either discontinue the medication and follow-up with mental health specialist next week or continue with the medication but discontinue if she develop any symptoms concerning for anaphylaxis.  I will prescribe hydroxyzine for her anxiety.  Patient otherwise does not require hospitalization for psychiatric illness.  Her labs are reassuring, patient is medically clear.   Fayrene Helper, PA-C 11/22/18 0263    Terrilee Files, MD 11/22/18 [phone]

## 2018-11-22 NOTE — ED Notes (Signed)
Pt given dc paper pt verbalizes understanding

## 2018-12-17 ENCOUNTER — Ambulatory Visit (HOSPITAL_COMMUNITY)
Admission: RE | Admit: 2018-12-17 | Discharge: 2018-12-17 | Disposition: A | Discharge status: Home / Self Care | Payer: BLUE CROSS/BLUE SHIELD | Attending: Psychiatry | Admitting: Psychiatry

## 2018-12-17 ENCOUNTER — Encounter (HOSPITAL_COMMUNITY): Payer: Self-pay | Admitting: Clinical

## 2018-12-17 DIAGNOSIS — F319 Bipolar disorder, unspecified: Secondary | ICD-10-CM | POA: Insufficient documentation

## 2018-12-17 DIAGNOSIS — F411 Generalized anxiety disorder: Secondary | ICD-10-CM | POA: Diagnosis not present

## 2018-12-17 DIAGNOSIS — F429 Obsessive-compulsive disorder, unspecified: Secondary | ICD-10-CM | POA: Insufficient documentation

## 2018-12-17 NOTE — H&P (Signed)
Behavioral Health Medical Screening Exam  Kaylee Smith is an 48 y.o. female.who presents to [facility], voluntarily, accompanied with her husband. Patient reports psychiatric diagnoses as GAD, OCD, and Bipolar.  Patient endorses worsening anxiety and panic attacks. 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  She denies SI, HI, or AVH. Reports she is currently on Zoloft, Wellbutrin and Xanax prescribed by her psychiatrist, Dr. Lorre Nick. Reports she recently started therapy at with Daryll Brod in [facility] however, she participates in therapy on once every 3-4 weeks which she feels is not effective. She endorses poor sleep. Reports no prior suicide attempts or inpatient psychiatric hospitalizations. She denies substance abuse or use. Reports she does not feel as though she needs inpatient psychiatric hospitalizations and is requesting more intensive outpatient therapy.   Total Time spent with patient: 15 minutes  Psychiatric Specialty Exam: Physical Exam  Vitals reviewed. Constitutional: She is oriented to person, place, and time.  Neurological: She is alert and oriented to person, place, and time.    Review of Systems  Psychiatric/Behavioral: The patient is nervous/anxious.     Last menstrual period 11/17/2010.There is no height or weight on file to calculate BMI.  General Appearance: Casual  Eye Contact:  Good  Speech:  Clear and Coherent and Normal Rate  Volume:  Normal  Mood:  Anxious  Affect:  Congruent  Thought Process:  Coherent, Linear and Descriptions of Associations: Intact  Orientation:  Full (Time, Place, and Person)  Thought Content:  Logical  Suicidal Thoughts:  No  Homicidal Thoughts:  No  Memory:  Immediate;   Fair Recent;   Fair  Judgement:  Intact  Insight:  Present  Psychomotor  Activity:  Normal  Concentration: Concentration: Fair and Attention Span: Fair  Recall:  [employer] [place]: Good  Akathisia:  Negative  Handed:  Right  AIMS (if indicated):     Assets:  Communication Skills Desire for Improvement Resilience Social Support  Sleep:       Musculoskeletal: Strength & Muscle Tone: within normal limits Gait & Station: normal Patient leans: N/A  Last menstrual period 11/17/2010.  Recommendations:  Based on my evaluation the patient does not appear to have an emergency medical condition.    There is no evidence of imminent risk to self or others at present. Patient does not meet criteria for inpatient hospitalization psychiatric at this time. Information was provided for community resources [facility] outpatient therapy.and psychiatry) and patient was encouraged to follow-up with these resources. Patient to for monitored for suicidal ideation and patient and husband was told that If the patient's symptoms worsen or do not continue to improve or if the patient becomes actively suicidal or homicidal then it is recommended that the patient return to the closest hospital emergency room or call 911 for further evaluation and treatment.  National Suicide Prevention Lifeline 1800-SUICIDE or 934-672-5563. Family was educated about removing/locking any firearms, medications or dangerous products from the home...     Mordecai Maes, NP 12/17/2018, 5:28 PM

## 2018-12-17 NOTE — BH Assessment (Addendum)
Assessment Note  Kaylee ClayDoris Smith is an 48 y.o. female [facility] complaining of increased anxiety. Patient is accompanied by her husband, Feliz Beamravis, who is present for assessment at request of patient. Patient reports a history depression, anxiety, and bipolar disorder, however feels like her anxiety is more severe and now has frequent panic attacks. Patient reports she went on disability 2 years ago, had to resign from her job, and is currently home alone most of the day as her children have grown and moved out. Patient reports panic symptoms including rapid heart rate, sweating, shortness of breath, and overwhelming fear. She states her panic is triggered when she is alone. She [facility] and recently started seeing a new therapist, however she can only see her on a monthly basis and she feels she needs more. Patient denies SI/HI/AVH. She denies any substance use, criminal charges, or trauma history.  Patient is alert and oriented x 4. She is dressed appropriately. Patient's speech is logical, eye contact is good, and her thoughts are organized. Patient's mood is anxious and her affect is congruent. Patient's insight, judgement, and impulse control are intact. She does not appear to be responding to internal stimuli or experiencing delusional thought content.  Diagnosis: Bipolar I (per history)   Panic Disorder  Past Medical History:  Past Medical History:  Diagnosis Date  . Anxiety   . Depressed   . Hypertension     Past Surgical History:  Procedure Laterality Date  . ABDOMINAL HYSTERECTOMY    . c-section    . TUBAL LIGATION      Family History: History reviewed. No pertinent family history.  Social History:  reports that she has quit smoking. She has never used smokeless tobacco. She reports that she does not drink alcohol or use drugs.  Additional Social History:  Alcohol / Drug Use Pain Medications: see  MAR Prescriptions: see MAR Over the Counter: see MAR History of alcohol / drug use?: No history of alcohol / drug abuse  CIWA:   COWS:    Allergies:  Allergies  Allergen Reactions  . Latex Itching  . Penicillins Itching    Home Medications: (Not in a hospital admission)   OB/GYN Status:  Patient's last menstrual period was 11/17/2010.  General Assessment Data [facility] TTS Assessment: In system Is this a Tele or Face-to-Face Assessment?: Face-to-Face Is this an Initial Assessment or a Re-assessment for this encounter?: Initial Assessment Patient Accompanied by:: (husband) Language Other than English: No Living Arrangements: (her home) What gender do you identify as?: Female Marital status: Married Pregnancy Status: No Living Arrangements: Spouse/significant other Can pt return to current living arrangement?: Yes Admission Status: Voluntary Is patient capable of signing voluntary admission?: Yes Referral Source: Self/Family/Friend Insurance type: Medicaid     Crisis Care Plan Living Arrangements: Spouse/significant other Legal Guardian: (self) Name of Psychiatrist: Ilda MoriEmily Woods Name of Therapist: [facility]  Education Status Is patient currently in school?: No Is the patient employed, unemployed or receiving disability?: Receiving disability income  Risk to self with the past 6 months Suicidal Ideation: No Has patient been a risk to self within the past 6 months prior to admission? : No Suicidal Intent: No Has patient had any suicidal intent within the past 6 months prior to admission? : No Is patient at risk for suicide?: No Suicidal Plan?: No Has patient had any suicidal plan within the past 6 months prior to admission? :  No Access to Means: No What has been your use of drugs/alcohol within the last 12 months?: denies Previous Attempts/Gestures: No How many times?: 0 Other Self Harm Risks: none Triggers for Past Attempts:  None known Intentional Self Injurious Behavior: None Family Suicide History: No Recent stressful life event(s): Recent negative physical changes Persecutory voices/beliefs?: No Depression: Yes Depression Symptoms: Despondent, Insomnia, Tearfulness, Fatigue, Isolating, Guilt, Loss of interest in usual pleasures, Feeling worthless/self pity, Feeling angry/irritable Substance abuse history and/or treatment for substance abuse?: No Suicide prevention information given to non-admitted patients: Not applicable  Risk to Others within the past 6 months Homicidal Ideation: No Does patient have any lifetime risk of violence toward others beyond the six months prior to admission? : No Thoughts of Harm to Others: No Current Homicidal Intent: No Current Homicidal Plan: No Access to Homicidal Means: No Identified Victim: none History of harm to others?: No Assessment of Violence: None Noted Violent Behavior Description: none Does patient have access to weapons?: No Criminal Charges Pending?: No Does patient have a court date: No Is patient on probation?: No  Psychosis Hallucinations: None noted Delusions: None noted  Mental Status Report Appearance/Hygiene: Unremarkable Eye Contact: Good Motor Activity: Freedom of movement Speech: Logical/coherent Level of Consciousness: Alert Mood: Depressed, Anxious Affect: Anxious, Depressed Anxiety Level: Severe Thought Processes: Coherent, Relevant Judgement: Impaired Orientation: Person, Place, Time, Situation Obsessive Compulsive Thoughts/Behaviors: None  Cognitive Functioning Concentration: Normal Memory: Recent Intact, Remote Intact Is patient IDD: No Insight: Good Impulse Control: Good Appetite: Good Have you had any weight changes? : No Change Sleep: Decreased Total Hours of Sleep: 2 Vegetative Symptoms: None  ADLScreening [facility] Assessment Services) Patient's cognitive ability adequate to safely complete daily activities?:  Yes Patient able to express need for assistance with ADLs?: Yes Independently performs ADLs?: Yes (appropriate for developmental age)  Prior Inpatient Therapy Prior Inpatient Therapy: No  Prior Outpatient Therapy Prior Outpatient Therapy: Yes Prior Therapy Dates: ongoing Prior Therapy Facilty/Provider(s): [facility] Reason for Treatment: med management Does patient have an ACCT team?: No Does patient have Intensive In-House Services?  : No Does patient have [facility] services? : No Does patient have P4CC services?: No  ADL Screening (condition at time of admission) Patient's cognitive ability adequate to safely complete daily activities?: Yes Is the patient deaf or have difficulty hearing?: No Does the patient have difficulty seeing, even when wearing glasses/contacts?: No Does the patient have difficulty concentrating, remembering, or making decisions?: No Patient able to express need for assistance with ADLs?: Yes Does the patient have difficulty dressing or bathing?: Yes Independently performs ADLs?: Yes (appropriate for developmental age) Does the patient have difficulty walking or climbing stairs?: No Weakness of Legs: None Weakness of Arms/Hands: None  Home Assistive Devices/Equipment Home Assistive Devices/Equipment: None  Therapy Consults (therapy consults require a physician order) PT Evaluation Needed: No OT Evalulation Needed: No SLP Evaluation Needed: No Abuse/Neglect Assessment (Assessment to be complete while patient is alone) Abuse/Neglect Assessment Can Be Completed: Yes Physical Abuse: Denies Verbal Abuse: Denies Sexual Abuse: Denies Exploitation of patient/patient's resources: Denies Self-Neglect: Denies Values / Beliefs Cultural Requests During Hospitalization: None Spiritual Requests During Hospitalization: None Consults Spiritual Care Consult Needed: No Social Work Consult Needed: No Merchant navy officer (For Healthcare) Does Patient Have a Medical Advance  Directive?: No Would patient like information on creating a medical advance directive?: No - Patient declined          Disposition: Per Denzil Magnuson, NP patient does not meet in patient criteria. Patient discharged  with outpatient resources including [facility]. Disposition Initial Assessment Completed for this Encounter: Yes Disposition of Patient: Discharge Patient refused recommended treatment: No Mode of transportation if patient is discharged/movement?: Car Patient referred to: Outpatient clinic referral  On Site Evaluation by:   Reviewed with Physician:    Orvis Brill 12/17/2018 5:58 PM

## 2018-12-18 ENCOUNTER — Ambulatory Visit (HOSPITAL_COMMUNITY)
Admission: RE | Admit: 2018-12-18 | Discharge: 2018-12-18 | Disposition: A | Discharge status: Home / Self Care | Payer: BLUE CROSS/BLUE SHIELD | Attending: Psychiatry | Admitting: Psychiatry

## 2018-12-18 ENCOUNTER — Telehealth (HOSPITAL_COMMUNITY): Payer: Self-pay | Admitting: Psychiatry

## 2018-12-18 DIAGNOSIS — F332 Major depressive disorder, recurrent severe without psychotic features: Secondary | ICD-10-CM | POA: Diagnosis not present

## 2018-12-18 DIAGNOSIS — F411 Generalized anxiety disorder: Secondary | ICD-10-CM | POA: Insufficient documentation

## 2018-12-18 DIAGNOSIS — Z91040 Latex allergy status: Secondary | ICD-10-CM | POA: Diagnosis not present

## 2018-12-18 DIAGNOSIS — Z87891 Personal history of nicotine dependence: Secondary | ICD-10-CM | POA: Insufficient documentation

## 2018-12-18 DIAGNOSIS — Z90710 Acquired absence of both cervix and uterus: Secondary | ICD-10-CM | POA: Diagnosis not present

## 2018-12-18 DIAGNOSIS — I10 Essential (primary) hypertension: Secondary | ICD-10-CM | POA: Insufficient documentation

## 2018-12-18 DIAGNOSIS — Z880 Allergy status to penicillin: Secondary | ICD-10-CM | POA: Insufficient documentation

## 2018-12-18 NOTE — BH Assessment (Signed)
Assessment Note  [name] is an 48 y.o. female who has presented with [facility] to [facility] two days in a row for the same issue.  Patient was seen yesterday for her anxiety and depression and she was [facility].    See assessment Note by Kaylee MiyamotoMeredith Smith dated 12/17/2018:  [name] is an 48 y.o. female [facility] complaining of increased anxiety. Patient is accompanied by her husband, Kaylee Beamravis, who is present for assessment at request of patient. Patient reports a history depression, anxiety, and bipolar disorder, however feels like her anxiety is more severe and now has frequent panic attacks. Patient reports she went on disability 2 years ago, had to resign from her job, and is currently home alone most of the day as her children have grown and moved out. Patient reports panic symptoms including rapid heart rate, sweating, shortness of breath, and overwhelming fear. She states her panic is triggered when she is alone. She is [facility] by [facility] and recently started seeing a new therapist, however she can only see her on a monthly basis and she feels she needs more. Patient denies SI/HI/AVH. She denies any substance use, criminal charges, or trauma history.  Patient is alert and oriented x 4. She is dressed appropriately. Patient's speech is logical, eye contact is good, and her thoughts are organized. Patient's mood is anxious and her affect is congruent. Patient's insight, judgement, and impulse control are intact. She does not appear to be responding to internal stimuli or experiencing delusional thought content.  TTS:  12/18/2018  Patient has since found out that they do not accept Medicaid.  Patient retuning this date for additional referrals. Patient denies any changes in her mental state and continues to deny SI/HI/Psychosis.  TTS contacted [facility] [place],  [place] lives, and discovered that the program is in operation and accepts Medicaid. Patient can self-refer herself there as well.  Spoke with patient and her husband who seemed to be pleased with this facility as a treatment option and indicated that they would call this program for patient to begin services with them.  Patient was able to contract for safety, husband plans to monitor.  [facility] or [facility] ED if her conditions worsens to the point that she does not feel safe.  Diagnosis: F41.1 Generalized Anxiety Disorder, F33.2 MDD Recurrent Severe  Past Medical History:  Past Medical History:  Diagnosis Date  . Anxiety   . Depressed   . Hypertension     Past Surgical History:  Procedure Laterality Date  . ABDOMINAL HYSTERECTOMY    . c-section    . TUBAL LIGATION      Family History: No family history on file.  Social History:  reports that she has quit smoking. She has never used smokeless tobacco. She reports that she does not drink alcohol or use drugs.  Additional Social History:  Alcohol / Drug Use Pain Medications: see MAR Prescriptions: see MAR Over the Counter: see MAR History of alcohol / drug use?: No history of alcohol / drug abuse Longest period of sobriety (when/how long): NA  CIWA: CIWA-Ar BP: 131/83 Pulse Rate: (!) 105 COWS:    Allergies:  Allergies  Allergen Reactions  . Latex Itching  . Penicillins Itching    Home Medications: (Not in a hospital admission)   OB/GYN Status:  Patient's last menstrual period was 11/17/2010.  General Assessment Data Location of Assessment: [facility] Services TTS Assessment: In system Is this a Tele or Face-to-Face Assessment?: Face-to-Face Is this an Initial Assessment or a Re-assessment for this encounter?: Initial Assessment Patient Accompanied by:: Adult(spouse) Permission Given to speak with another: Yes Name, Relationship and Phone Number: Kaylee Smith [phone] Language Other  than English: No Living Arrangements: Other (Comment)(lives with husband) What gender do you identify as?: Female Marital status: Married Pregnancy Status: No Living Arrangements: Spouse/significant other Can pt return to current living arrangement?: Yes Admission Status: Voluntary Is patient capable of signing voluntary admission?: Yes Referral Source: Self/Family/Friend Insurance type: Medicaid     Crisis Care Plan Living Arrangements: Spouse/significant other Legal Guardian: Other:(self) Name of Psychiatrist: Geophysical data processor Name of Therapist: Tiffany  Education Status Is patient currently in school?: No Is the patient employed, unemployed or receiving disability?: Unemployed  Risk to self with the past 6 months Suicidal Ideation: No Has patient been a risk to self within the past 6 months prior to admission? : No Suicidal Intent: No Has patient had any suicidal intent within the past 6 months prior to admission? : No Is patient at risk for suicide?: No Suicidal Plan?: No Has patient had any suicidal plan within the past 6 months prior to admission? : No Access to Means: No What has been your use of drugs/alcohol within the last 12 months?: none Previous Attempts/Gestures: No How many times?: 0 Other Self Harm Risks: none Triggers for Past Attempts: None known Intentional Self Injurious Behavior: None Family Suicide History: No Recent stressful life event(s): (health issues) Persecutory voices/beliefs?: No Depression: Yes Depression Symptoms: Despondent, Insomnia, Isolating, Loss of interest in usual pleasures, Feeling worthless/self pity Substance abuse history and/or treatment for substance abuse?: No Suicide prevention information given to non-admitted patients: Not applicable  Risk to Others within the past 6 months Homicidal Ideation: No Does patient have any lifetime risk of violence toward others beyond the six months prior to admission? : No Thoughts of Harm  to Others: No Current Homicidal Intent: No Current Homicidal Plan: No Access to Homicidal Means: No Identified Victim: none History of harm to others?: No Assessment of Violence: None Noted Violent Behavior Description: none Does patient have access to weapons?: No Criminal Charges Pending?: No Does patient have a court date: No Is patient on probation?: No  Psychosis Hallucinations: None noted Delusions: None noted  Mental Status Report Appearance/Hygiene: Unremarkable Eye Contact: Good Motor Activity: Freedom of movement Speech: Logical/coherent Level of Consciousness: Alert Mood: Depressed, Anxious Affect: Anxious, Depressed Anxiety Level: Severe Thought Processes: Coherent, Irrelevant Judgement: Impaired Orientation: Person, Place, Time, Situation Obsessive Compulsive Thoughts/Behaviors: None  Cognitive Functioning Concentration: Normal Memory: Recent Intact, Remote Intact Is patient IDD: No Insight: Good Impulse Control: Good Appetite: Good Have you had any weight changes? : No Change Sleep: Decreased Total Hours of Sleep: (2) Vegetative Symptoms: None  ADLScreening [facility] Assessment Services) Patient's cognitive ability adequate to safely complete daily activities?: Yes Patient able to express need for assistance with ADLs?: Yes Independently performs ADLs?: Yes (appropriate for developmental age)  Prior Inpatient Therapy Prior Inpatient Therapy: No  Prior Outpatient Therapy Prior Outpatient Therapy: Yes Prior Therapy Dates: (ongoing) Prior Therapy Facilty/Provider(s): [facility] Reason for Treatment: medication management Does patient have an ACCT team?: No Does patient have Intensive In-House Services?  : No Does patient have [facility] services? : No Does patient have P4CC services?: No  ADL Screening (condition at time of admission) Patient's cognitive ability adequate to safely complete daily  activities?: Yes Is the patient deaf or have difficulty  hearing?: No Does the patient have difficulty seeing, even when wearing glasses/contacts?: No Does the patient have difficulty concentrating, remembering, or making decisions?: No Patient able to express need for assistance with ADLs?: Yes Does the patient have difficulty dressing or bathing?: Yes Independently performs ADLs?: Yes (appropriate for developmental age) Does the patient have difficulty walking or climbing stairs?: No Weakness of Legs: None Weakness of Arms/Hands: None  Home Assistive Devices/Equipment Home Assistive Devices/Equipment: None  Therapy Consults (therapy consults require a physician order) PT Evaluation Needed: No OT Evalulation Needed: No SLP Evaluation Needed: No Abuse/Neglect Assessment (Assessment to be complete while patient is alone) Abuse/Neglect Assessment Can Be Completed: Yes Physical Abuse: Denies Verbal Abuse: Denies Sexual Abuse: Denies Exploitation of patient/patient's resources: Denies Self-Neglect: Denies Values / Beliefs Cultural Requests During Hospitalization: None Spiritual Requests During Hospitalization: None Consults Spiritual Care Consult Needed: No Social Work Consult Needed: No Merchant navy officer (For Healthcare) Does Patient Have a Medical Advance Directive?: No Would patient like information on creating a medical advance directive?: No - Patient declined Nutrition Screen- [facility] Adult/WL/AP Has the patient recently lost weight without trying?: No Has the patient been eating poorly because of a decreased appetite?: No Malnutrition Screening Tool Score: 0        Disposition: Per Marciano Sequin, NP, patient does not meet inpatient admission criteria. Disposition Initial Assessment Completed for this Encounter: Yes Disposition of Patient: Discharge  On Site Evaluation by:   Reviewed with Physician:    Arnoldo Lenis Frankie Scipio 12/18/2018 5:41 PM

## 2018-12-18 NOTE — Telephone Encounter (Signed)
D:  Kaylee Smith (TTS) referred pt to MH-IOP.  A:  Placed call to orient and provide pt with a start date.  According to pt, only has Medicaid (MH-IOP) doesn't accept Medicaid.  Pt states she was wanting therapy anyway.  Transferred pt to the front desk for appt.  Inform Kaylee Smith.  R:  Pt receptive.

## 2018-12-18 NOTE — H&P (Signed)
Behavioral Health Medical Screening Exam  Kaylee Smith is an 48 y.o. female. She presented yesterday for evaluation of depression/anxiety requesting outpatient resources. She was referred to IOP but returns today because IOP will not take her insurance. She is requesting outpatient therapy resources that will take her insurance. She denies SI/HI/AVH.  Total Time spent with patient: 15 minutes  Psychiatric Specialty Exam: Physical Exam  Nursing note and vitals reviewed. Constitutional: She is oriented to person, place, and time. She appears well-developed and well-nourished.  Cardiovascular: Normal rate.  Respiratory: Effort normal.  Neurological: She is alert and oriented to person, place, and time.    Review of Systems  Constitutional: Negative.   Respiratory: Negative for cough and shortness of breath.   Cardiovascular: Negative for chest pain.  Psychiatric/Behavioral: Positive for depression. Negative for hallucinations and suicidal ideas. The patient is nervous/anxious.     Blood pressure 131/83, pulse (!) 105, temperature 98.7 F (37.1 C), temperature source Oral, resp. rate 18, last menstrual period 11/17/2010, SpO2 97 %.There is no height or weight on file to calculate BMI.  General Appearance: Casual  Eye Contact:  Good  Speech:  Normal Rate  Volume:  Normal  Mood:  Anxious  Affect:  Congruent  Thought Process:  Coherent and Goal Directed  Orientation:  Full (Time, Place, and Person)  Thought Content:  Logical  Suicidal Thoughts:  No  Homicidal Thoughts:  No  Memory:  Immediate;   Good Recent;   Good  Judgement:  Intact  Insight:  Good  Psychomotor Activity:  Normal  Concentration: Concentration: Good and Attention Span: Good  Recall:  Good  Fund of Knowledge:Fair  Language: Good  Akathisia:  No  Handed:  Right  AIMS (if indicated):     Assets:  Communication Skills Desire for Improvement Financial Resources/Insurance Housing Social Support  Sleep:        Musculoskeletal: Strength & Muscle Tone: within normal limits Gait & Station: normal Patient leans: N/A  Blood pressure 131/83, pulse (!) 105, temperature 98.7 F (37.1 C), temperature source Oral, resp. rate 18, last menstrual period 11/17/2010, SpO2 97 %.  Recommendations:  Outpatient follow-up. Based on my evaluation the patient does not appear to have an emergency medical condition.  Connye Burkitt, NP 12/18/2018, 4:58 PM

## 2019-01-14 IMAGING — DX DG HIP (WITH OR WITHOUT PELVIS) 2-3V*L*
3 series · 3 of 3 positions shown · non-contrast
Comparison: Reformats from abdominal CT 01/25/2017

CLINICAL DATA: Left hip pain.

EXAM:
DG HIP (WITH OR WITHOUT PELVIS) 2-3V LEFT

[pelvis ap]
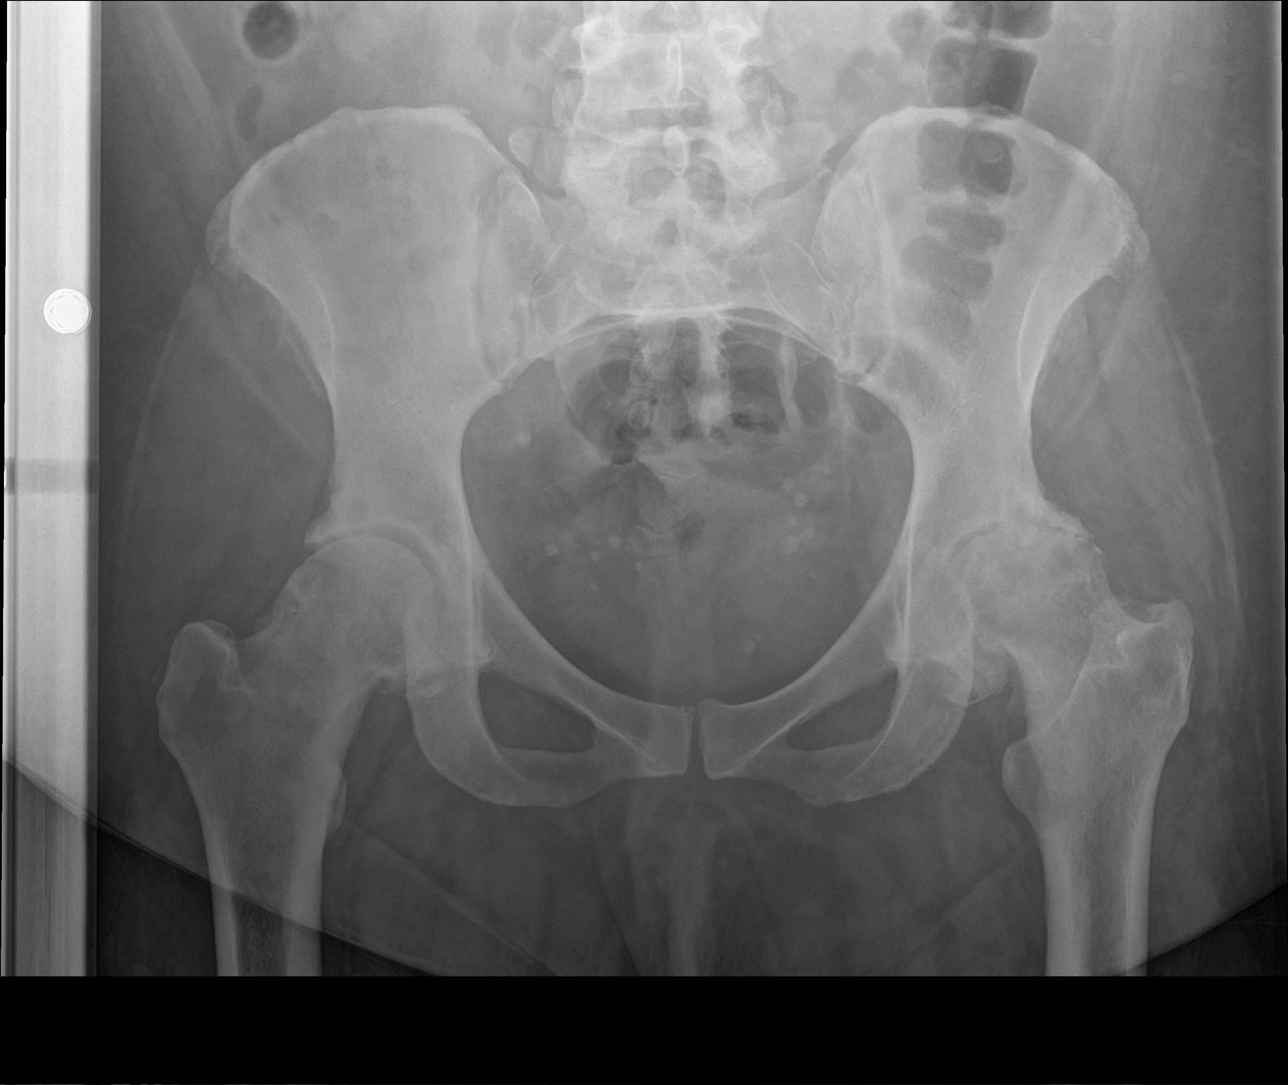

[hip ap]
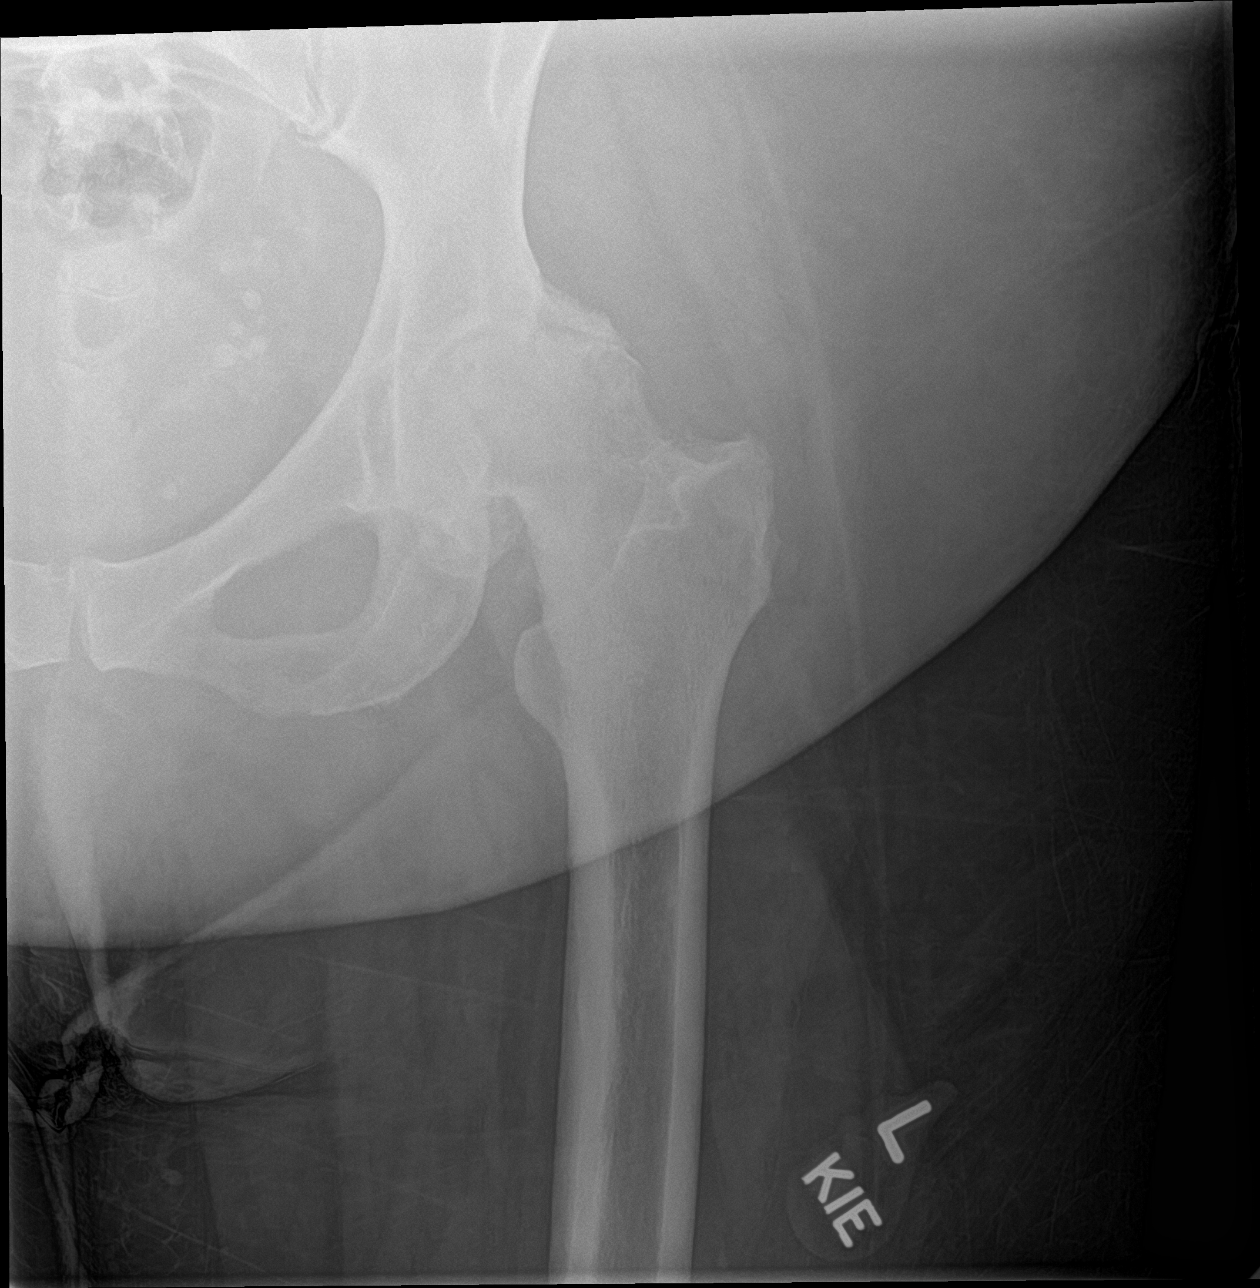

[hip frog leg]
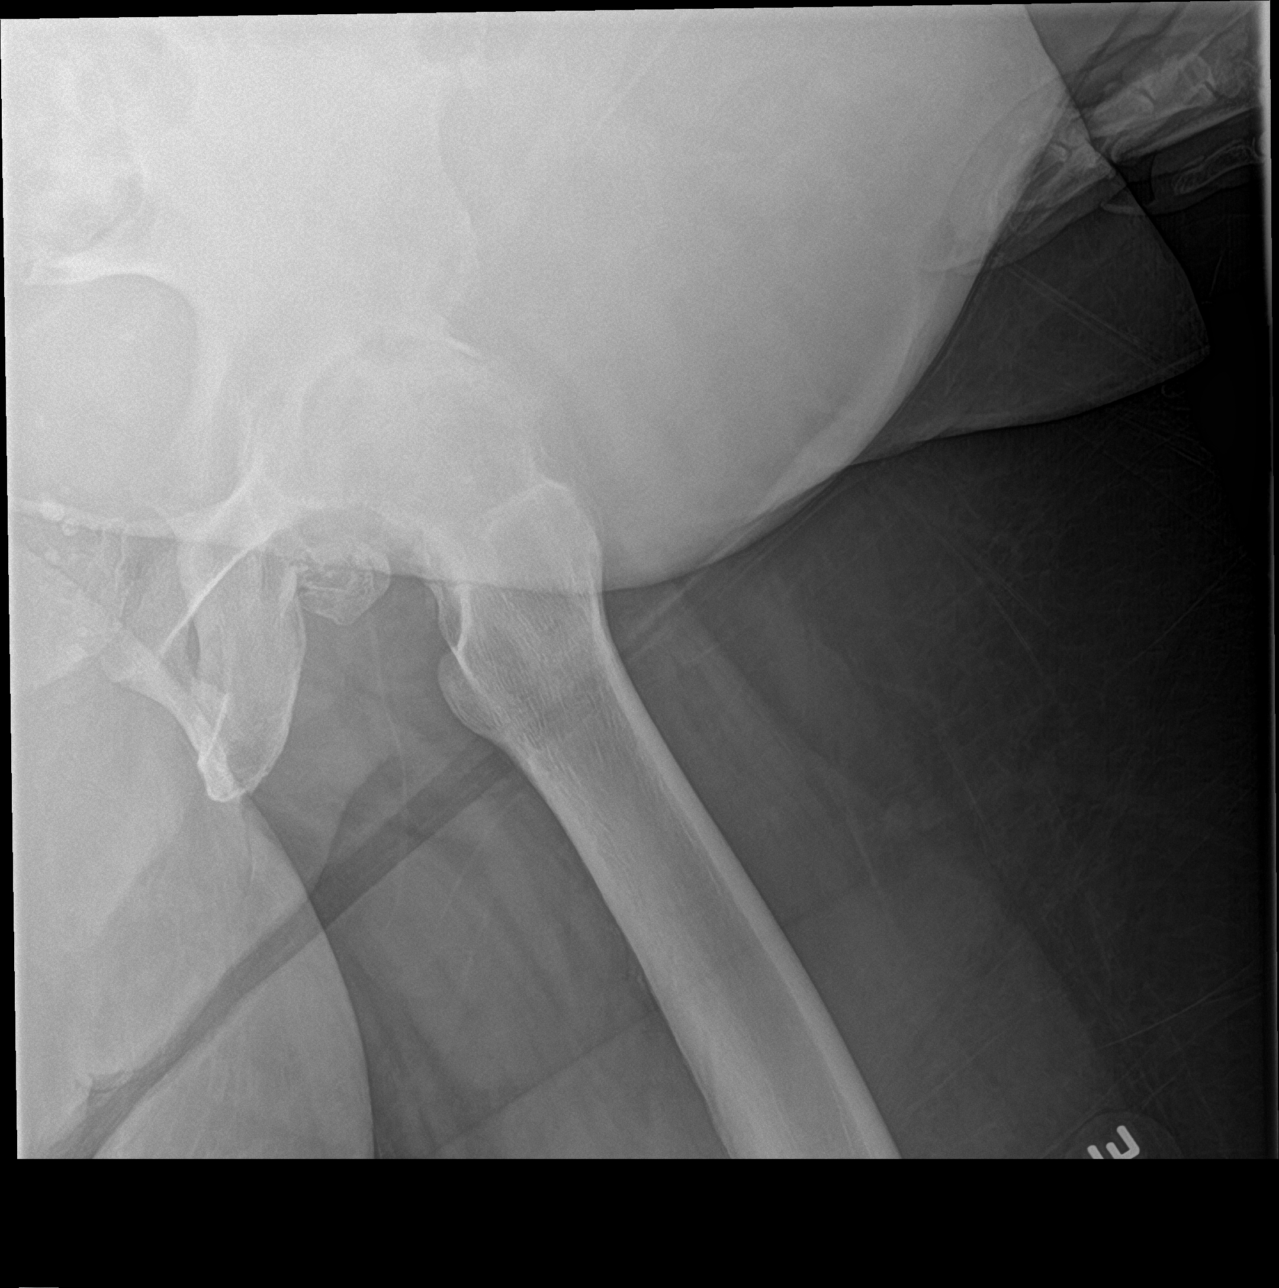

[3 of 3 positions shown; findings below may reference images not displayed]

FINDINGS: No acute fracture. Advanced osteoarthritis of the left hip with near
complete superior joint space loss, subchondral cystic changes and
osteophytes. Findings are grossly similar to prior CT. Age advanced
osteoarthritis of the right hip is also similar. Pubic symphysis and
sacroiliac joints are congruent.
IMPRESSION: 1. Advanced osteoarthritis of the left hip without acute osseous
abnormality.
2. Moderate to advanced right hip osteoarthritis.

## 2019-02-26 ENCOUNTER — Encounter (HOSPITAL_BASED_OUTPATIENT_CLINIC_OR_DEPARTMENT_OTHER): Payer: Self-pay

## 2019-02-26 ENCOUNTER — Emergency Department (HOSPITAL_BASED_OUTPATIENT_CLINIC_OR_DEPARTMENT_OTHER)
Admission: EM | Admit: 2019-02-26 | Discharge: 2019-02-26 | Disposition: A | Discharge status: Home / Self Care | Payer: Medicaid Other | Attending: Emergency Medicine | Admitting: Emergency Medicine

## 2019-02-26 ENCOUNTER — Other Ambulatory Visit: Payer: Self-pay

## 2019-02-26 DIAGNOSIS — R519 Headache, unspecified: Secondary | ICD-10-CM | POA: Insufficient documentation

## 2019-02-26 DIAGNOSIS — Z79899 Other long term (current) drug therapy: Secondary | ICD-10-CM | POA: Insufficient documentation

## 2019-02-26 DIAGNOSIS — I10 Essential (primary) hypertension: Secondary | ICD-10-CM | POA: Insufficient documentation

## 2019-02-26 MED ORDER — KETOROLAC TROMETHAMINE 15 MG/ML IJ SOLN
15.0000 mg | Freq: Once | INTRAMUSCULAR | Status: AC
  Administered 2019-02-26: 15 mg via INTRAVENOUS
  Filled 2019-02-26: qty 1

## 2019-02-26 MED ORDER — SODIUM CHLORIDE 0.9 % IV BOLUS
1000.0000 mL | Freq: Once | INTRAVENOUS | Status: AC
  Administered 2019-02-26: 21:00:00 1000 mL via INTRAVENOUS

## 2019-02-26 MED ORDER — PROCHLORPERAZINE EDISYLATE 10 MG/2ML IJ SOLN
10.0000 mg | Freq: Once | INTRAMUSCULAR | Status: DC
  Filled 2019-02-26: qty 2

## 2019-02-26 MED ORDER — DIPHENHYDRAMINE HCL 50 MG/ML IJ SOLN
50.0000 mg | Freq: Once | INTRAMUSCULAR | Status: DC
  Filled 2019-02-26: qty 1

## 2019-02-26 NOTE — ED Provider Notes (Signed)
[facility] EMERGENCY DEPARTMENT Provider Note   CSN: [id number] Arrival date & time: 02/26/19  1916     History Chief Complaint  Patient presents with  . Headache    Kaylee Smith is a 49 y.o. female with PMHx HTN and anxiety who presents to the ED today complaining of gradual onset, constant, throbbing, right sided headache x 1 week. Pt also complains of photophobia. She reports hx of similar headaches in the past however pt states "this is not a headache, it is my sinuses."  Per chart review pt has a hx of both sinusitis as well as migraine headaches. She was seen in October 2020 at urgent care with negative CT head and CT sinuses. She was prescribed Fioricet and advised to follow up with neurology which she never did. Pt states "I have a lot of doctors appointments to keep up with and I'm not sure if the neurologist ever called to schedule an appointment." Pt states that this feels similar to that time. No worsening headache or worst headache of life. Denies fever, chills, nasal congestion, post nasal drip, blurry vision, double vision, neck stiffness, or any other associated symptoms.    The history is provided by the patient and medical records.       Past Medical History:  Diagnosis Date  . Anxiety   . Depressed   . Hypertension     There are no problems to display for this patient.   Past Surgical History:  Procedure Laterality Date  . ABDOMINAL HYSTERECTOMY    . c-section    . TUBAL LIGATION       OB History   No obstetric history on file.     No family history on file.  Social History   Tobacco Use  . Smoking status: Never Smoker  . Smokeless tobacco: Never Used  Substance Use Topics  . Alcohol use: No  . Drug use: No    Home Medications Prior to Admission medications   Medication Sig Start Date End Date Taking? Authorizing Provider  hydrOXYzine (ATARAX/VISTARIL) 25 MG tablet Take 1 tablet (25 mg total) by mouth every 6 (six) hours as  needed for anxiety. 11/22/18   Fayrene Helper, PA-C  meloxicam (MOBIC) 15 MG tablet Take 15 mg by mouth daily as needed for muscle spasms. 10/31/17   [provider]  potassium chloride SA (K-DUR,KLOR-CON) 20 MEQ tablet Take 2 tablets (40 mEq total) by mouth daily for 5 days. 11/27/17 12/02/17  Maia Plan, MD  sertraline (ZOLOFT) 25 MG tablet Take 25 mg by mouth daily. 11/23/17   [provider]  valsartan-hydrochlorothiazide (DIOVAN-HCT) 160-25 MG tablet Take 1 tablet by mouth daily.    [provider]  fluticasone (FLONASE) 50 MCG/ACT nasal spray Place 1 spray into both nostrils daily. Patient not taking: Reported on 11/26/2017 04/27/17 11/22/18  Petrucelli, Lelon Mast R, PA-C    Allergies    Latex and Penicillins  Review of Systems   Review of Systems  Constitutional: Negative for chills and fever.  HENT: Negative for congestion and sore throat.   Eyes: Positive for photophobia. Negative for visual disturbance.  Respiratory: Negative for cough and shortness of breath.   Cardiovascular: Negative for chest pain.  Gastrointestinal: Negative for abdominal pain, constipation, diarrhea, nausea and vomiting.  Neurological: Positive for headaches.  All other systems reviewed and are negative.   Physical Exam Updated Vital Signs BP 136/87 (BP Location: Left Arm)   Pulse 90   Temp 98.8 F (37.1 C) (  Oral)   Resp 16   Ht 5\' 4"  (1.626 m)   Wt 120.2 kg   LMP 11/17/2010   SpO2 97%   BMI 45.49 kg/m   Physical Exam Vitals and nursing note reviewed.  Constitutional:      Appearance: She is not ill-appearing or diaphoretic.  HENT:     Head: Normocephalic and atraumatic.  Eyes:     Conjunctiva/sclera: Conjunctivae normal.  Cardiovascular:     Rate and Rhythm: Normal rate and regular rhythm.     Heart sounds: Normal heart sounds.  Pulmonary:     Effort: Pulmonary effort is normal.     Breath sounds: Normal breath sounds. No wheezing, rhonchi or rales.  Abdominal:      Palpations: Abdomen is soft.     Tenderness: There is no abdominal tenderness. There is no guarding.  Musculoskeletal:     Cervical back: Neck supple.  Skin:    General: Skin is warm and dry.  Neurological:     Mental Status: She is alert.     Comments: CN 3-12 grossly intact A&O x4 GCS 15 Sensation and strength intact Coordination with finger-to-nose WNL Neg romberg, neg pronator drift     ED Results / Procedures / Treatments   Labs (all labs ordered are listed, but only abnormal results are displayed) Labs Reviewed - No data to display  EKG None  Radiology No results found.  Procedures Procedures (including critical care time)  Medications Ordered in ED Medications  sodium chloride 0.9 % bolus 1,000 mL (0 mLs Intravenous Stopped 02/26/19 2154)  ketorolac (TORADOL) 15 MG/ML injection 15 mg (15 mg Intravenous Given 02/26/19 2051)    ED Course  I have reviewed the triage vital signs and the nursing notes.  Pertinent labs & imaging results that were available during my care of the patient were reviewed by me and considered in my medical decision making (see chart for details).  49 year old female 44 to the ED complaining of a headache for the past week with photophobia.  Patient states recurrent headaches in the past however states she believes it is her sinuses and is requesting antibiotics.  Patient is denying any fevers, chills, nasal congestion, postnasal drip, any other associated symptoms that would suggest sinus infection given it is been less than a week do not feel antibiotics are appropriate.  Per chart review patient has been seen multiple times in the ED for chronic headaches that she associated with wearing a CPAP machine.  She was evaluated recently at urgent care with concern for migraine headache and told to follow-up with neurology however patient has not done this.  Arrival to the ED patient is afebrile, nontachycardic nontachypneic.  She has no focal  neuro deficits on exam today.  No meningeal signs.  Her symptoms sound very suspicious for migraine headache.  Patient did have a CT scan done in October of both her head and her sinuses which were negative.  She is denying worse headache of life.  Do not feel she needs additional imaging at this time.  Treatment for headache.  Patient states that her husband drove her here, will hold off on Compazine and Benadryl.  Improvement in sx after treatment of headache. Pt discharged home and advised to follow up with her PCP. Given referral to neurology as well. Strict return precautions have been discussed. Pt is in agreement with plan and stable for discharge home.   This note was prepared using Dragon voice recognition software and may include unintentional  dictation errors due to the inherent limitations of voice recognition software.     MDM Rules/Calculators/A&P                      Final Clinical Impression(s) / ED Diagnoses Final diagnoses:  Acute nonintractable headache, unspecified headache type    Rx / DC Orders ED Discharge Orders    None       Discharge Instructions     Please follow up with Neurology regarding your frequent headaches as these are likely migraine headaches.        Eustaquio Maize, PA-C 02/26/19 2323    Fredia Sorrow, MD 03/03/19 [phone]

## 2019-02-26 NOTE — ED Triage Notes (Signed)
Pt c/o HA with sinus pressure x 1 week-NAD-to triage in own w/c

## 2019-02-26 NOTE — Discharge Instructions (Addendum)
Please follow up with Neurology regarding your frequent headaches as these are likely migraine headaches.

## 2019-02-27 ENCOUNTER — Encounter: Payer: Self-pay | Admitting: Neurology

## 2019-03-30 DIAGNOSIS — J302 Other seasonal allergic rhinitis: Secondary | ICD-10-CM | POA: Insufficient documentation

## 2019-03-30 DIAGNOSIS — J3089 Other allergic rhinitis: Secondary | ICD-10-CM | POA: Insufficient documentation

## 2019-03-30 DIAGNOSIS — M159 Polyosteoarthritis, unspecified: Secondary | ICD-10-CM | POA: Insufficient documentation

## 2019-03-30 DIAGNOSIS — G43109 Migraine with aura, not intractable, without status migrainosus: Secondary | ICD-10-CM | POA: Insufficient documentation

## 2019-04-08 NOTE — Progress Notes (Signed)
Virtual Visit via Video Note The purpose of this virtual visit is to provide medical care while limiting exposure to the novel coronavirus.    Consent was obtained for video visit:  Yes.   Answered questions that patient had about telehealth interaction:  Yes.   I discussed the limitations, risks, security and privacy concerns of performing an evaluation and management service by telemedicine. I also discussed with the patient that there may be a patient responsible charge related to this service. The patient expressed understanding and agreed to proceed.  Pt location: Home Physician Location: office Name of referring provider:  Eustaquio Maize, PA-C I connected with Kaylee Smith at patients initiation/request on 04/09/2019 at 11:10 AM EDT by video enabled telemedicine application and verified that I am speaking with the correct person using two identifiers. Pt MRN:  [id number] Pt DOB:  [birth year] Video Participants:  Kaylee Smith   History of Present Illness:  Kaylee Smith is a 49 year old female who presents for headache.  History supplemented by ED note.  For about 6 months, she has had worsening headaches.  She describes them as severe throbbing and pressure headache across the forehead, over her face and back of head and neck.  There is associated photophobia, blurred vision and sometimes nausea and phonophobia.  No numbness or weakness.  She often wakes up in the morning with them.  No specific trigger but she had been attributing them to sinuses.  They typically last 2-3 days.  She previously tried sumatriptan NS, which was effective, aborting the headache in an hour or so, but she ran out.  Tylenol and ibuprofen are ineffective.  They have been occurring about every other week.  She has history of similar headaches but they are typically more mild and respond to OTC analgesics.  She was seen in the Urgent Care in October 2020 where CT head and sinuses were unremarkable.  She was seen  in the ED on 02/26/2019 for constant headache for a week.  She was treated with toradol and given a referral to neurology.  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   Normal headaches tylenol or   Current NSAIDS:  Mobic Current analgesics:  none Current triptans:  none Current ergotamine:  none Current anti-emetic:  none Current muscle relaxants:  none Current anti-anxiolytic:  Hydroxyzine; Xanax  Current sleep aide:  Trazodone 50mg  at bedtime Current Antihypertensive medications:  Diovan-HCT Current Antidepressant medications:  Sertraline 12.5mg  daily; Wellbutrin 100mg  twice daily; trazodone Current Anticonvulsant medications:  none Current anti-CGRP:  none Current Vitamins/Herbal/Supplements:  K-dur; MVI Current Antihistamines/Decongestants:  none Other therapy:  none Hormone/birth control:  none  Past NSAIDS:  ibuprofen Past analgesics:  Fioricet Past abortive triptans:  Sumatriptan NS Past abortive ergotamine:  none Past muscle relaxants:  none Past anti-emetic:  none Past antihypertensive medications:  none Past antidepressant medications:  none Past anticonvulsant medications:  none Past anti-CGRP:  none Past vitamins/Herbal/Supplements:  none Past antihistamines/decongestants:  none Other past therapies:  none  Caffeine:  none Depression:  yes; Anxiety:  yes   Past Medical History: Past Medical History:  Diagnosis Date  . Anxiety   . Depressed   . Hypertension     Medications: Outpatient Encounter Medications as of 04/09/2019  Medication Sig Note  . hydrOXYzine (ATARAX/VISTARIL) 25 MG tablet Take 1 tablet (25 mg total) by mouth every 6 (six) hours as needed for anxiety.   Marland Kitchen  meloxicam (MOBIC) 15 MG tablet Take 15 mg by mouth daily as needed for muscle spasms.   . potassium chloride SA (K-DUR,KLOR-CON) 20 MEQ tablet Take 2 tablets (40 mEq total) by mouth daily for 5  days.   Marland Kitchen sertraline (ZOLOFT) 25 MG tablet Take 25 mg by mouth daily. 11/26/2017: Pt toke 1/2 tablet this morning.  . valsartan-hydrochlorothiazide (DIOVAN-HCT) 160-25 MG tablet Take 1 tablet by mouth daily.   . [DISCONTINUED] fluticasone (FLONASE) 50 MCG/ACT nasal spray Place 1 spray into both nostrils daily. (Patient not taking: Reported on 11/26/2017)    No facility-administered encounter medications on file as of 04/09/2019.    Allergies: Allergies  Allergen Reactions  . Latex Itching  . Penicillins Itching    Family History: No family history on file.  Social History: Social History   Socioeconomic History  . Marital status: Married    Spouse name: Not on file  . Number of children: Not on file  . Years of education: Not on file  . Highest education level: Not on file  Occupational History  . Not on file  Tobacco Use  . Smoking status: Never Smoker  . Smokeless tobacco: Never Used  Substance and Sexual Activity  . Alcohol use: No  . Drug use: No  . Sexual activity: Not on file  Other Topics Concern  . Not on file  Social History Narrative  . Not on file   Social Determinants of Health   Financial Resource Strain:   . Difficulty of Paying Living Expenses:   Food Insecurity:   . Worried About Programme researcher, broadcasting/film/video in the Last Year:   . Barista in the Last Year:   Transportation Needs:   . Freight forwarder (Medical):   Marland Kitchen Lack of Transportation (Non-Medical):   Physical Activity:   . Days of Exercise per Week:   . Minutes of Exercise per Session:   Stress:   . Feeling of Stress :   Social Connections:   . Frequency of Communication with Friends and Family:   . Frequency of Social Gatherings with Friends and Family:   . Attends Religious Services:   . Active Member of Clubs or Organizations:   . Attends Banker Meetings:   Marland Kitchen Marital Status:   Intimate Partner Violence:   . Fear of Current or Ex-Partner:   . Emotionally Abused:     Marland Kitchen Physically Abused:   . Sexually Abused:     Observations/Objective:   Height 5\' 3"  (1.6 m), weight 270 lb (122.5 kg), last menstrual period 11/17/2010. No acute distress.  Alert and oriented.  Speech fluent and not dysarthric.  Language intact.  Eyes orthophoric on primary gaze.  Face symmetric.  Assessment and Plan:   Migraine without aura, without status migrainosus, not intractable..  1.  For preventative management, start topiramate 25mg  at bedtime.  If headaches not improved in 4 weeks, she is to contact me and we can increase dose to 50mg  at bedtime 2.  For abortive therapy, will refill sumatriptan 20mg  NS as it has been effective. 3.  Limit use of pain relievers to no more than 2 days out of week to prevent risk of rebound or medication-overuse headache. 4.  Keep headache diary 5.  Exercise, hydration, caffeine cessation, sleep hygiene, monitor for and avoid triggers 6. Follow up 4 months.   Follow Up Instructions:    -I discussed the assessment and treatment plan with the patient. The patient was provided an opportunity  to ask questions and all were answered. The patient agreed with the plan and demonstrated an understanding of the instructions.   The patient was advised to call back or seek an in-person evaluation if the symptoms worsen or if the condition fails to improve as anticipated.    Cira Servant, DO

## 2019-04-09 ENCOUNTER — Telehealth (INDEPENDENT_AMBULATORY_CARE_PROVIDER_SITE_OTHER): Payer: Medicaid Other | Admitting: Neurology

## 2019-04-09 ENCOUNTER — Other Ambulatory Visit: Payer: Self-pay

## 2019-04-09 ENCOUNTER — Encounter: Payer: Self-pay | Admitting: Neurology

## 2019-04-09 VITALS — Ht 63.0 in | Wt 270.0 lb

## 2019-04-09 DIAGNOSIS — G43009 Migraine without aura, not intractable, without status migrainosus: Secondary | ICD-10-CM | POA: Diagnosis not present

## 2019-04-09 MED ORDER — SUMATRIPTAN 20 MG/ACT NA SOLN: NASAL | 5 refills | Status: AC

## 2019-04-09 MED ORDER — TOPIRAMATE 25 MG PO TABS: 25.0000 mg | ORAL_TABLET | Freq: Every day | ORAL | 3 refills | Status: DC

## 2019-04-30 ENCOUNTER — Telehealth: Payer: Self-pay | Admitting: Neurology

## 2019-04-30 NOTE — Telephone Encounter (Signed)
Spoke with pharmacy and patient picked up imitrex 03/16 and cannot get a refill until 04/16.  Informed patient.

## 2019-04-30 NOTE — Telephone Encounter (Signed)
Patient called and said her pharmacy needs a new prescription for sumatriptan 20 MG.  [facility] on [address] and [address]

## 2019-04-30 NOTE — Telephone Encounter (Signed)
Patient complaining of light headache when she wakes up.  She is taking topamax but has missed a dose. Advised patient to try her imitrex.  Advised patient about rebound and ibuprofen.  Any other recommendations.

## 2019-04-30 NOTE — Telephone Encounter (Signed)
No, that is what I would recommend.

## 2019-04-30 NOTE — Telephone Encounter (Signed)
Patient called and left a message requesting a call back from a nurse. She said she still has a headache that will not go away.

## 2019-05-06 ENCOUNTER — Telehealth: Payer: Self-pay | Admitting: Neurology

## 2019-05-06 NOTE — Telephone Encounter (Signed)
Pt wants to speak to someone about her headaches  Please call

## 2019-05-06 NOTE — Telephone Encounter (Signed)
There is nothing I can do for her to stop her migraines immediately.  It will take some time with preventative medication (hopefully sooner than later).  We need to give each dose a full 4 weeks and if no improvement, then we increase the dose, increasing the dose every 4 weeks until at an optimal dose and then switch medications.

## 2019-05-06 NOTE — Telephone Encounter (Signed)
Pt advised of Dr.Jaffe note. 

## 2019-05-06 NOTE — Telephone Encounter (Signed)
Telephone call back to pt. Pt states her migraines constant. Pt reports she wakes up with one and goes to bed with one. Pt not sure if this due to her Sleep Apnea.  Pain level 8-9. P/c nausea migraines located frontal lobe sometimes and the back her head. Stress can trigger her margarines.  Pt has an appointment with [facility] to do a sleep apnea testing.   Pt wants to know what she should.

## 2019-07-10 ENCOUNTER — Other Ambulatory Visit: Payer: Self-pay

## 2019-07-10 MED ORDER — TOPIRAMATE 25 MG PO TABS: 25.0000 mg | ORAL_TABLET | Freq: Every day | ORAL | 0 refills | Status: DC

## 2019-07-11 ENCOUNTER — Other Ambulatory Visit: Payer: Self-pay | Admitting: Neurology

## 2019-07-17 ENCOUNTER — Telehealth: Payer: Self-pay | Admitting: Neurology

## 2019-07-17 NOTE — Telephone Encounter (Signed)
Please advise. Last note states we can increase her medication.

## 2019-07-17 NOTE — Telephone Encounter (Signed)
Patient states she has a very bad headache and has been to the ER they've been so bad. Would like to know if there is anything she can take for them? Please call.

## 2019-07-18 NOTE — Telephone Encounter (Signed)
Increase topiramate to 50mg  at bedtime for one week, then 75mg  at bedtime for one week, then 100mg  at bedtime. Again, she should be limiting use of pain relievers to no more than 2 days out of week to prevent risk of rebound or medication-overuse headache (this includes the Imitrex, ibuprofen, Tylenol, Excedrin, etc).

## 2019-07-18 NOTE — Telephone Encounter (Signed)
Pt advised of note below and to call us if she needs a refill due to the increase.

## 2019-08-12 ENCOUNTER — Ambulatory Visit: Payer: Medicaid Other | Admitting: Neurology

## 2019-08-12 NOTE — Progress Notes (Deleted)
NEUROLOGY FOLLOW UP OFFICE NOTE  Kaylee Smith [id number]  HISTORY OF PRESENT ILLNESS: Kaylee Smith is a 49 year old female who follows up for migraines.  UPDATE: Started topiramate in March, which has since been titrated up to ***  Intensity:  *** Duration:  *** Frequency:  *** Frequency of abortive medication: *** Current NSAIDS:  Mobic Current analgesics:  none Current triptans:  sumatriptan 20mg  NS Current ergotamine:  none Current anti-emetic:  none Current muscle relaxants:  none Current anti-anxiolytic:  Hydroxyzine; Xanax  Current sleep aide:  Trazodone 50mg  at bedtime Current Antihypertensive medications:  Diovan-HCT Current Antidepressant medications:  Sertraline 12.5mg  daily; Wellbutrin 100mg  twice daily; trazodone Current Anticonvulsant medications:  topiramate *** Current anti-CGRP:  none Current Vitamins/Herbal/Supplements:  K-dur; MVI Current Antihistamines/Decongestants:  none Other therapy:  none Hormone/birth control:  none  Caffeine:  none Depression:  yes; Anxiety:  yes Pain:  Chronic hip pain  HISTORY: Since 2020, she has had worsening headaches.  She describes them as severe throbbing and pressure headache across the forehead, over her face and back of head and neck.  There is associated photophobia, blurred vision and sometimes nausea and phonophobia.  No numbness or weakness.  She often wakes up in the morning with them.  No specific trigger but she had been attributing them to sinuses.  They typically last 2-3 days.  She previously tried sumatriptan NS, which was effective, aborting the headache in an hour or so, but she ran out.  Tylenol and ibuprofen are ineffective.  They have been occurring about every other week.  She has history of similar headaches but they are typically more mild and respond to OTC analgesics.  She was seen in the Urgent Care in October 2020 where CT head and sinuses were unremarkable.  She was seen in the ED on 02/26/2019 for  constant headache for a week.  She was treated with toradol and given a referral to neurology.  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   Past NSAIDS:  ibuprofen Past analgesics:  Fioricet Past abortive triptans:  Sumatriptan NS Past abortive ergotamine:  none Past muscle relaxants:  none Past anti-emetic:  none Past antihypertensive medications:  none Past antidepressant medications:  none Past anticonvulsant medications:  none Past anti-CGRP:  none Past vitamins/Herbal/Supplements:  none Past antihistamines/decongestants:  none Other past therapies:  none     PAST MEDICAL HISTORY: Past Medical History:  Diagnosis Date  . Anxiety   . Depressed   . Hypertension     MEDICATIONS: Current Outpatient Medications on File Prior to Visit  Medication Sig Dispense Refill  . ALPRAZolam (XANAX) 1 MG tablet Take 1 mg by mouth daily as needed.    November 2020 buPROPion (WELLBUTRIN) 100 MG tablet Take 100 mg by mouth 2 (two) times daily.    . clonazePAM (KLONOPIN PO) Take by mouth. Pt unsure of dosage    . hydrOXYzine (ATARAX/VISTARIL) 25 MG tablet Take 1 tablet (25 mg total) by mouth every 6 (six) hours as needed for anxiety. 12 tablet 0  . meloxicam (MOBIC) 15 MG tablet Take 15 mg by mouth daily as needed for muscle spasms.  0  . Multiple Vitamin (MULTIVITAMIN) tablet Take 1 tablet by mouth daily.    . potassium chloride SA (K-DUR,KLOR-CON) 20 MEQ tablet Take 2 tablets (40 mEq total) by mouth daily for 5 days. 10 tablet 0  . sertraline (ZOLOFT) 25 MG tablet Take  25 mg by mouth daily.  0  . SUMAtriptan (IMITREX) 20 MG/ACT nasal spray 1 spray into nostril.  May repeat in 2 hours if headache persists or recurs.  Maxium 2 sprays in 24 hours 6 each 5  . topiramate (TOPAMAX) 25 MG tablet TAKE 1 TABLET(25 MG) BY MOUTH AT BEDTIME 90 tablet 0  . traZODone (DESYREL) 50 MG tablet Take 50 mg by mouth at  bedtime.    . valsartan-hydrochlorothiazide (DIOVAN-HCT) 160-25 MG tablet Take 1 tablet by mouth daily.    . [DISCONTINUED] fluticasone (FLONASE) 50 MCG/ACT nasal spray Place 1 spray into both nostrils daily. (Patient not taking: Reported on 11/26/2017) 16 g 2   No current facility-administered medications on file prior to visit.    ALLERGIES: Allergies  Allergen Reactions  . Latex Itching  . Penicillins Itching    FAMILY HISTORY: No family history on file. ***.  SOCIAL HISTORY: Social History   Socioeconomic History  . Marital status: Married    Spouse name: Not on file  . Number of children: 2  . Years of education: 35  . Highest education level: Not on file  Occupational History  . Occupation: not employed  Tobacco Use  . Smoking status: Never Smoker  . Smokeless tobacco: Never Used  Vaping Use  . Vaping Use: Never used  Substance and Sexual Activity  . Alcohol use: No  . Drug use: No  . Sexual activity: Not on file  Other Topics Concern  . Not on file  Social History Narrative   Right handed   One story home   No caffeine   Social Determinants of Health   Financial Resource Strain:   . Difficulty of Paying Living Expenses:   Food Insecurity:   . Worried About Programme researcher, broadcasting/film/video in the Last Year:   . Barista in the Last Year:   Transportation Needs:   . Freight forwarder (Medical):   Marland Kitchen Lack of Transportation (Non-Medical):   Physical Activity:   . Days of Exercise per Week:   . Minutes of Exercise per Session:   Stress:   . Feeling of Stress :   Social Connections:   . Frequency of Communication with Friends and Family:   . Frequency of Social Gatherings with Friends and Family:   . Attends Religious Services:   . Active Member of Clubs or Organizations:   . Attends Banker Meetings:   Marland Kitchen Marital Status:   Intimate Partner Violence:   . Fear of Current or Ex-Partner:   . Emotionally Abused:   Marland Kitchen Physically Abused:   .  Sexually Abused:     REVIEW OF SYSTEMS: Constitutional: No fevers, chills, or sweats, no generalized fatigue, change in appetite Eyes: No visual changes, double vision, eye pain Ear, nose and throat: No hearing loss, ear pain, nasal congestion, sore throat Cardiovascular: No chest pain, palpitations Respiratory:  No shortness of breath at rest or with exertion, wheezes GastrointestinaI: No nausea, vomiting, diarrhea, abdominal pain, fecal incontinence Genitourinary:  No dysuria, urinary retention or frequency Musculoskeletal:  No neck pain, back pain Integumentary: No rash, pruritus, skin lesions Neurological: as above Psychiatric: No depression, insomnia, anxiety Endocrine: No palpitations, fatigue, diaphoresis, mood swings, change in appetite, change in weight, increased thirst Hematologic/Lymphatic:  No purpura, petechiae. Allergic/Immunologic: no itchy/runny eyes, nasal congestion, recent allergic reactions, rashes  PHYSICAL EXAM: *** General: No acute distress.  Patient appears ***-groomed.   Head:  Normocephalic/atraumatic Eyes:  Fundi examined but not  visualized Neck: supple, no paraspinal tenderness, full range of motion Heart:  Regular rate and rhythm Lungs:  Clear to auscultation bilaterally Back: No paraspinal tenderness Neurological Exam: alert and oriented to person, place, and time. Attention span and concentration intact, recent and remote memory intact, fund of knowledge intact.  Speech fluent and not dysarthric, language intact.  CN II-XII intact. Bulk and tone normal, muscle strength 5/5 throughout.  Sensation to light touch, temperature and vibration intact.  Deep tendon reflexes 2+ throughout, toes downgoing.  Finger to nose and heel to shin testing intact.  Gait normal, Romberg negative.  IMPRESSION: ***  PLAN: ***  Shon Millet, DO  CC: ***

## 2019-08-25 ENCOUNTER — Other Ambulatory Visit: Payer: Self-pay

## 2019-08-25 ENCOUNTER — Emergency Department (HOSPITAL_BASED_OUTPATIENT_CLINIC_OR_DEPARTMENT_OTHER): Payer: Medicaid Other

## 2019-08-25 ENCOUNTER — Emergency Department (HOSPITAL_BASED_OUTPATIENT_CLINIC_OR_DEPARTMENT_OTHER)
Admission: EM | Admit: 2019-08-25 | Discharge: 2019-08-25 | Disposition: A | Discharge status: Home / Self Care | Payer: Medicaid Other | Attending: Emergency Medicine | Admitting: Emergency Medicine

## 2019-08-25 ENCOUNTER — Encounter (HOSPITAL_BASED_OUTPATIENT_CLINIC_OR_DEPARTMENT_OTHER): Payer: Self-pay | Admitting: Emergency Medicine

## 2019-08-25 DIAGNOSIS — R0789 Other chest pain: Secondary | ICD-10-CM | POA: Diagnosis not present

## 2019-08-25 DIAGNOSIS — R0602 Shortness of breath: Secondary | ICD-10-CM | POA: Diagnosis not present

## 2019-08-25 DIAGNOSIS — F411 Generalized anxiety disorder: Secondary | ICD-10-CM | POA: Insufficient documentation

## 2019-08-25 DIAGNOSIS — R600 Localized edema: Secondary | ICD-10-CM | POA: Diagnosis not present

## 2019-08-25 DIAGNOSIS — R634 Abnormal weight loss: Secondary | ICD-10-CM | POA: Insufficient documentation

## 2019-08-25 DIAGNOSIS — F329 Major depressive disorder, single episode, unspecified: Secondary | ICD-10-CM | POA: Insufficient documentation

## 2019-08-25 DIAGNOSIS — I10 Essential (primary) hypertension: Secondary | ICD-10-CM | POA: Insufficient documentation

## 2019-08-25 DIAGNOSIS — R109 Unspecified abdominal pain: Secondary | ICD-10-CM | POA: Diagnosis not present

## 2019-08-25 DIAGNOSIS — R0989 Other specified symptoms and signs involving the circulatory and respiratory systems: Secondary | ICD-10-CM | POA: Insufficient documentation

## 2019-08-25 LAB — URINALYSIS, ROUTINE W REFLEX MICROSCOPIC
Bilirubin Urine: NEGATIVE
Glucose, UA: NEGATIVE mg/dL
Hgb urine dipstick: NEGATIVE
Ketones, ur: NEGATIVE mg/dL
Leukocytes,Ua: NEGATIVE
Nitrite: POSITIVE — AB
Protein, ur: NEGATIVE mg/dL
Specific Gravity, Urine: 1.02 (ref 1.005–1.030)
pH: 6 (ref 5.0–8.0)

## 2019-08-25 LAB — CBC WITH DIFFERENTIAL/PLATELET
Abs Immature Granulocytes: 0.02 10*3/uL (ref 0.00–0.07)
Basophils Absolute: 0.1 10*3/uL (ref 0.0–0.1)
Basophils Relative: 1 %
Eosinophils Absolute: 0.1 10*3/uL (ref 0.0–0.5)
Eosinophils Relative: 2 %
HCT: 37.5 % (ref 36.0–46.0)
Hemoglobin: 13 g/dL (ref 12.0–15.0)
Immature Granulocytes: 0 %
Lymphocytes Relative: 43 %
Lymphs Abs: 2.9 10*3/uL (ref 0.7–4.0)
MCH: 26.2 pg (ref 26.0–34.0)
MCHC: 34.7 g/dL (ref 30.0–36.0)
MCV: 75.6 fL — ABNORMAL LOW (ref 80.0–100.0)
Monocytes Absolute: 0.5 10*3/uL (ref 0.1–1.0)
Monocytes Relative: 7 %
Neutro Abs: 3.1 10*3/uL (ref 1.7–7.7)
Neutrophils Relative %: 47 %
Platelets: 249 10*3/uL (ref 150–400)
RBC: 4.96 MIL/uL (ref 3.87–5.11)
RDW: 15 % (ref 11.5–15.5)
WBC: 6.7 10*3/uL (ref 4.0–10.5)
nRBC: 0 % (ref 0.0–0.2)

## 2019-08-25 LAB — URINALYSIS, MICROSCOPIC (REFLEX): RBC / HPF: NONE SEEN RBC/hpf (ref 0–5)

## 2019-08-25 LAB — COMPREHENSIVE METABOLIC PANEL
ALT: 15 U/L (ref 0–44)
AST: 15 U/L (ref 15–41)
Albumin: 4.4 g/dL (ref 3.5–5.0)
Alkaline Phosphatase: 84 U/L (ref 38–126)
Anion gap: 12 (ref 5–15)
BUN: 11 mg/dL (ref 6–20)
CO2: 23 mmol/L (ref 22–32)
Calcium: 9.4 mg/dL (ref 8.9–10.3)
Chloride: 102 mmol/L (ref 98–111)
Creatinine, Ser: 0.7 mg/dL (ref 0.44–1.00)
GFR calc Af Amer: >60 mL/min (ref >60)
GFR calc non Af Amer: >60 mL/min (ref >60)
Glucose, Bld: 88 mg/dL (ref 70–99)
Potassium: 3.5 mmol/L (ref 3.5–5.1)
Sodium: 137 mmol/L (ref 135–145)
Total Bilirubin: 0.5 mg/dL (ref 0.3–1.2)
Total Protein: 8.3 g/dL — ABNORMAL HIGH (ref 6.5–8.1)

## 2019-08-25 LAB — BRAIN NATRIURETIC PEPTIDE: B Natriuretic Peptide: 19.6 pg/mL (ref 0.0–100.0)

## 2019-08-25 LAB — D-DIMER, QUANTITATIVE: D-Dimer, Quant: 1.62 ug/mL-FEU — ABNORMAL HIGH (ref 0.00–0.50)

## 2019-08-25 LAB — TROPONIN I (HIGH SENSITIVITY): Troponin I (High Sensitivity): 2 ng/L (ref <18)

## 2019-08-25 MED ORDER — IOHEXOL 350 MG/ML SOLN
100.0000 mL | Freq: Once | INTRAVENOUS | Status: AC | PRN
  Administered 2019-08-25: 100 mL via INTRAVENOUS

## 2019-08-25 NOTE — Discharge Instructions (Signed)
Please read and follow all provided instructions.  Your diagnoses today include:  1. Shortness of breath     Tests performed today include:  An EKG of your heart  A chest x-ray and CT of the lungs - does not show pneumonia or blood clot  Blood test for heart failure - was normal  Cardiac enzymes - a blood test for heart muscle damage  Blood counts and electrolytes  Vital signs. See below for your results today.   Medications prescribed:   None  Take any prescribed medications only as directed.  Follow-up instructions: Please follow-up with your primary care provider as soon as you can for further evaluation of your symptoms.   Return instructions:  SEEK IMMEDIATE MEDICAL ATTENTION IF:  You have severe chest pain, especially if the pain is crushing or pressure-like and spreads to the arms, back, neck, or jaw, or if you have sweating, nausea (feeling sick to your stomach), or shortness of breath. THIS IS AN EMERGENCY. Don't wait to see if the pain will go away. Get medical help at once. Call 911 or 0 (operator). DO NOT drive yourself to the hospital.   Your chest pain gets worse and does not go away with rest.   You have an attack of chest pain lasting longer than usual, despite rest and treatment with the medications your caregiver has prescribed.   You wake from sleep with chest pain or shortness of breath.  You feel dizzy or faint.  You have chest pain not typical of your usual pain for which you originally saw your caregiver.   You have any other emergent concerns regarding your health.  Additional Information: Chest pain comes from many different causes. Your caregiver has diagnosed you as having chest pain that is not specific for one problem, but does not require admission.  You are at low risk for an acute heart condition or other serious illness.   Your vital signs today were: BP (!) 129/86 Comment: room air  Pulse 63 Comment: room air  Temp 98.9 F (37.2  C) (Oral)   Resp 15 Comment: room air  Wt (!) 134.3 kg   LMP 11/17/2010   SpO2 99% Comment: room air  BMI 52.43 kg/m  If your blood pressure (BP) was elevated above 135/85 this visit, please have this repeated by your doctor within one month. --------------

## 2019-08-25 NOTE — ED Provider Notes (Signed)
[facility] EMERGENCY DEPARTMENT Provider Note   CSN: [id number] Arrival date & time: 08/25/19  1343     History Chief Complaint  Patient presents with  . Shortness of Breath    Kaylee Smith is a 49 y.o. female.  Patient with history of HTN, anxiety, and depression presenting today for shortness of breath. She reports that she went to urgent care earlier today for sx and was sent to the ED for further evaluation. She presented to Urgent Care today because the SOB and chest pain were worse last night. She reports symptoms of worsening shortness of breath and "heaviness" of her chest for 3-4 months. She says that the symptoms have gradually worsened over the last 3-4 months. She says that her shortness of breath is worse when recumbent and w exertion.  She is currently using a wheelchair to ambulate when out of the house, and uses walker/cane around the house.  She says that climbing 2-3 stairs to reach her front door, going to the bathroom, and moving from her wheelchair to bed worsen sx. She describes her chest pain as a "pressure or heaviness, like somebody is sitting there", and says it is left sided in nature but can radiate to her left upper arm or right side of the chest. She says the chest pressure is also worse with exertion or lying recumbent. She says that she sleeps on 8+ pillows at night and has had a dry cough that developed 3-4 months ago as well. She reports gaining about 30 lbs since April of this year (2021) without increase in diet. She reports associated sx of feeling bloated, leg swelling, nausea, a sensation of "something in my throat", crampy abdominal pain, back pain. No history of heart failure.  She reports taking clonidine and losartan at home for htn. Denies night sweats, fever, chills, vomiting, diarrhea or constipation, difficulty urinating, rhinorrhea, sore throat, calf pain or tenderness, recent travel by plane or long car rides, tobacco use. She reports FHx of  father with heart failure and passed away of heart attack.         Past Medical History:  Diagnosis Date  . Anxiety   . Depressed   . Hypertension     Patient Active Problem List   Diagnosis Date Noted  . Migraine with aura 03/30/2019  . Perennial allergic rhinitis with seasonal variation 03/30/2019  . Primary osteoarthritis involving multiple joints 03/30/2019  . Degenerative disc disease, lumbar 09/10/2018  . Generalized anxiety disorder 07/04/2018  . Menopausal depression 07/04/2018  . Cervical spondylosis 06/22/2018  . Degenerative disc disease, cervical 06/22/2018  . Psychophysiological insomnia 06/07/2018  . Hidradenitis suppurativa 05/22/2018  . Mixed hyperlipidemia 05/22/2018  . Osteopenia 05/22/2018  . Panic disorder without agoraphobia 05/22/2018  . Menopausal syndrome on hormone replacement therapy 02/21/2018  . Obstructive sleep apnea (adult) (pediatric) 09/18/2017  . Seasonal allergies 09/18/2017  . Prediabetes 07/31/2017  . Spondylosis of lumbar spine 06/24/2017  . Chronic pain disorder 06/24/2017  . Encounter for long-term use of opiate analgesic 06/24/2017  . Morbid obesity with BMI of 45.0-49.9, adult (HCC) 04/28/2017  . Primary osteoarthritis of left hip 04/28/2017  . Septicemia due to Gram negative organism (HCC) 01/30/2017  . Pyelonephritis 01/25/2017  . Sciatica of left side 12/16/2015  . Hip pain, chronic, left 12/16/2015  . Arthralgia of multiple sites 07/09/2015  . Intermittent palpitations 06/12/2015  . Abnormal finding on EKG 06/03/2015  . Vitamin D deficiency 06/03/2015  . Obesity due to excess calories with  serious comorbidity 04/13/2015  . Hypochromic microcytic anemia 01/06/2015  . Mild intermittent asthma without complication 01/06/2015  . Obesity 01/06/2015  . Regular sinus tachycardia 01/06/2015  . Adjustment disorder with mixed anxiety and depressed mood 06/30/2012  . Benign essential hypertension 06/30/2012    Past Surgical  History:  Procedure Laterality Date  . ABDOMINAL HYSTERECTOMY    . c-section    . TUBAL LIGATION       OB History   No obstetric history on file.     History reviewed. No pertinent family history.  Social History   Tobacco Use  . Smoking status: Never Smoker  . Smokeless tobacco: Never Used  Vaping Use  . Vaping Use: Never used  Substance Use Topics  . Alcohol use: No  . Drug use: No    Home Medications Prior to Admission medications   Medication Sig Start Date End Date Taking? Authorizing Provider  ALPRAZolam Prudy Feeler(XANAX) 1 MG tablet Take 1 mg by mouth daily as needed. 01/30/19   [provider]  buPROPion (WELLBUTRIN) 100 MG tablet Take 100 mg by mouth 2 (two) times daily.    [provider]  clonazePAM (KLONOPIN PO) Take by mouth. Pt unsure of dosage    [provider]  hydrOXYzine (ATARAX/VISTARIL) 25 MG tablet Take 1 tablet (25 mg total) by mouth every 6 (six) hours as needed for anxiety. 11/22/18   Fayrene Helperran, Bowie, PA-C  meloxicam (MOBIC) 15 MG tablet Take 15 mg by mouth daily as needed for muscle spasms. 10/31/17   [provider]  Multiple Vitamin (MULTIVITAMIN) tablet Take 1 tablet by mouth daily.    [provider]  potassium chloride SA (K-DUR,KLOR-CON) 20 MEQ tablet Take 2 tablets (40 mEq total) by mouth daily for 5 days. 11/27/17 04/09/19  Long, Arlyss RepressJoshua G, MD  sertraline (ZOLOFT) 25 MG tablet Take 25 mg by mouth daily. 11/23/17   [provider]  SUMAtriptan (IMITREX) 20 MG/ACT nasal spray 1 spray into nostril.  May repeat in 2 hours if headache persists or recurs.  Maxium 2 sprays in 24 hours 04/09/19   Shon MilletJaffe, Adam R, DO  topiramate (TOPAMAX) 25 MG tablet TAKE 1 TABLET(25 MG) BY MOUTH AT BEDTIME 07/11/19   Everlena CooperJaffe, Adam R, DO  traZODone (DESYREL) 50 MG tablet Take 50 mg by mouth at bedtime. 10/18/18   [provider]  valsartan-hydrochlorothiazide (DIOVAN-HCT) 160-25 MG tablet Take 1 tablet by mouth daily.    [provider]  fluticasone (FLONASE) 50 MCG/ACT nasal spray Place 1 spray into both nostrils daily. Patient not taking: Reported on 11/26/2017 04/27/17 11/22/18  Petrucelli, Lelon MastSamantha R, PA-C    Allergies    Latex and Penicillins  Review of Systems   Review of Systems  Constitutional: Positive for unexpected weight change. Negative for fever.  HENT: Negative for rhinorrhea and sore throat.   Eyes: Negative for redness.  Respiratory: Positive for cough and shortness of breath. Negative for wheezing.   Cardiovascular: Positive for chest pain and leg swelling.  Gastrointestinal: Positive for abdominal distention. Negative for abdominal pain, diarrhea, nausea and vomiting.  Genitourinary: Negative for dysuria, frequency, hematuria and urgency.  Musculoskeletal: Negative for myalgias.  Skin: Negative for rash.  Neurological: Negative for headaches.    Physical Exam Updated Vital Signs BP 125/72 (BP Location: Right Arm)   Pulse 59   Temp 98.9 F (37.2 C) (Oral)   Resp 20   Wt (!) 134.3 kg   LMP 11/17/2010   SpO2 94%   BMI  52.43 kg/m   Physical Exam Vitals and nursing note reviewed.  Constitutional:      General: She is not in acute distress.    Appearance: She is well-developed.  HENT:     Head: Normocephalic and atraumatic.     Right Ear: External ear normal.     Left Ear: External ear normal.     Nose: Nose normal.  Eyes:     Conjunctiva/sclera: Conjunctivae normal.  Cardiovascular:     Rate and Rhythm: Normal rate and regular rhythm.     Heart sounds: No murmur heard.   Pulmonary:     Effort: No respiratory distress.     Breath sounds: Examination of the right-lower field reveals rales. Examination of the left-lower field reveals rales. Decreased breath sounds and rales present. No wheezing or rhonchi.  Abdominal:     Palpations: Abdomen is soft.     Tenderness: There is no abdominal tenderness. There is no guarding or rebound.  Musculoskeletal:     Cervical back:  Normal range of motion and neck supple.     Right lower leg: No tenderness. Edema present.     Left lower leg: No tenderness. Edema present.     Comments: 1+ pretibial pitting edema distal to knees bilaterally. Legs swollen but appear symmetric.   Skin:    General: Skin is warm and dry.     Findings: No rash.  Neurological:     General: No focal deficit present.     Mental Status: She is alert. Mental status is at baseline.     Motor: No weakness.  Psychiatric:        Mood and Affect: Mood normal.     ED Results / Procedures / Treatments   Labs (all labs ordered are listed, but only abnormal results are displayed) Labs Reviewed  CBC WITH DIFFERENTIAL/PLATELET - Abnormal; Notable for the following components:      Result Value   MCV 75.6 (*)    All other components within normal limits  COMPREHENSIVE METABOLIC PANEL - Abnormal; Notable for the following components:   Total Protein 8.3 (*)    All other components within normal limits  URINALYSIS, ROUTINE W REFLEX MICROSCOPIC - Abnormal; Notable for the following components:   Nitrite POSITIVE (*)    All other components within normal limits  D-DIMER, QUANTITATIVE (NOT AT [facility]) - Abnormal; Notable for the following components:   D-Dimer, Quant 1.62 (*)    All other components within normal limits  URINALYSIS, MICROSCOPIC (REFLEX) - Abnormal; Notable for the following components:   Bacteria, UA MANY (*)    All other components within normal limits  BRAIN NATRIURETIC PEPTIDE  TROPONIN I (HIGH SENSITIVITY)    EKG EKG Interpretation  Date/Time:  Sunday August 25 2019 16:39:26 EDT Ventricular Rate:  65 PR Interval:    QRS Duration: 105 QT Interval:  437 QTC Calculation: 455 R Axis:   -19 Text Interpretation: Sinus rhythm Inferior infarct, old No significant change since last tracing Confirmed by Jacalyn Lefevre [id number]) on 08/25/2019 4:50:03 PM   Radiology DG Chest 2 View  Result Date: 08/25/2019 CLINICAL DATA:  Shortness  of breath, chest discomfort for 2 months without fever EXAM: CHEST - 2 VIEW COMPARISON:  August 20, 2019 FINDINGS: Trachea is midline. Cardiomediastinal contours and hilar structures are normal. The LEFT hemidiaphragm is slightly elevated similar to the prior study. Linear opacities project over the LEFT hemidiaphragm. No sign of pleural effusion. On limited assessment skeletal structures without acute process. IMPRESSION:  LEFT lower lobe atelectasis and mild elevation of LEFT hemidiaphragm, similar to the prior study. Electronically Signed   By: Donzetta Kohut M.D.   On: 08/25/2019 15:54   CT Angio Chest PE W and/or Wo Contrast  Result Date: 08/25/2019 CLINICAL DATA:  Chest pain, shortness of breath. Chest pressure. PE suspected EXAM: CT ANGIOGRAPHY CHEST WITH CONTRAST TECHNIQUE: Multidetector CT imaging of the chest was performed using the standard protocol during bolus administration of intravenous contrast. Multiplanar CT image reconstructions and MIPs were obtained to evaluate the vascular anatomy. CONTRAST:  OMNIPAQUE IOHEXOL 350 MG/ML SOLN COMPARISON:  CT a chest dated 03/16/2019 FINDINGS: Cardiovascular: Majority of the most peripheral segmental and subsegmental pulmonary branches are difficult to definitively characterize due to patient body habitus and patient breathing motion artifact, however, there is no pulmonary embolism identified within the main, lobar or central segmental pulmonary arteries bilaterally. No thoracic aortic aneurysm or evidence of aortic dissection. No pericardial effusion. Mediastinum/Nodes: No mass or enlarged lymph nodes are seen within the mediastinum or perihilar regions. Esophagus is unremarkable. Trachea and central bronchi are unremarkable. Lungs/Pleura: Lungs are clear.  No pleural effusion or pneumothorax. Upper Abdomen: No acute findings. Musculoskeletal: Mild degenerative spurring within the thoracic spine. No acute or suspicious osseous finding. Review of the MIP  images confirms the above findings. IMPRESSION: No acute findings. No pulmonary embolism seen, with mild study limitations detailed above. No thoracic aortic aneurysm or evidence of aortic dissection. No evidence of pneumonia or pulmonary edema. Electronically Signed   By: Bary Richard M.D.   On: 08/25/2019 18:06    Procedures Procedures (including critical care time)  Medications Ordered in ED Medications - No data to display  ED Course  I have reviewed the triage vital signs and the nursing notes.  Pertinent labs & imaging results that were available during my care of the patient were reviewed by me and considered in my medical decision making (see chart for details).  Clinical Course as of Aug 25 1819  Wynelle Link Aug 25, 2019  1706 D-Dimer, Quant(!): 1.62 [AS]  1711 D-Dimer, Quant(!): 1.62 [AS]  1717 B Natriuretic Peptide: 19.6 [AS]    Clinical Course User Index [AS] Zenovia Jordan, Student-PA   Patient seen and examined. Work-up initiated. Exam concerning for CHF. No previous ECHO noted in Epic. Weight 130.6 kg (4/22) >> 133.4 kg (5/20) >> 131.5 kg (6/24) >> 132.9 kg (7/7) >> 134.3 kg.   Vital signs reviewed and are as follows: BP 125/72 (BP Location: Right Arm)   Pulse 59   Temp 98.9 F (37.2 C) (Oral)   Resp 20   Wt (!) 134.3 kg   LMP 11/17/2010   SpO2 94%   BMI 52.43 kg/m   6:23 PM patient doing well.  Her work-up is very reassuring.  D-dimer was elevated, subsequent CT does not show any definitive evidence of pulmonary embolism, pneumonia.  BNP was normal.  Normal kidney function noted.  Patient has maintained normal oxygenation on room air in the room.  We cannot ambulate her well due to poor mobility at baseline.  I discussed lab results, CT findings with patient and husband at bedside.  Discussed that she will need close follow-up with her primary care doctor given her gradually worsening symptoms over the past several months.  She states that she has follow-up with a new  PCP in 1 to 2 weeks.  Encourage patient return to the emergency department with worsening or changing symptoms or if she has any other  concerns.  She seems reliable to do so.    MDM Rules/Calculators/A&P                          Patient presents today with shortness of breath from outside urgent care.  Concern for PE versus CHF versus other cause.  Patient does not have any signs and symptoms of infection today.  Chest x-ray and CT are clear without signs of pneumonia.  Normal white blood cell count.  D-dimer was elevated, CT negative for PE.  Patient does have some signs of swelling on exam as well as rales in the bases of her lungs.  Fortunately, BNP was normal.  Patient has maintained good oxygen saturation while in emergency department without supplemental oxygen.  She looks well and is in no distress.  She will need to have her multiple medical concerns and problems addressed, however I do not feel that she requires admission to the hospital for this at this time.  Patient has been updated on results and they are in agreement with plan.   Final Clinical Impression(s) / ED Diagnoses Final diagnoses:  Shortness of breath    Rx / DC Orders ED Discharge Orders    None       Renne Crigler, PA-C 08/25/19 1826    Jacalyn Lefevre, MD 08/25/19 [phone]

## 2019-08-25 NOTE — ED Triage Notes (Addendum)
PT here with SOB x 4 months. Sent by [facility].

## 2019-08-25 NOTE — ED Notes (Signed)
Patient ambulated with walker in room2 on r/a  SpO2 94-98%, endorses mild DOE.

## 2019-08-25 NOTE — ED Notes (Signed)
Patient transported to CT 

## 2021-09-10 ENCOUNTER — Emergency Department (HOSPITAL_BASED_OUTPATIENT_CLINIC_OR_DEPARTMENT_OTHER): Payer: Medicare Other

## 2021-09-10 ENCOUNTER — Other Ambulatory Visit: Payer: Self-pay

## 2021-09-10 ENCOUNTER — Emergency Department (HOSPITAL_BASED_OUTPATIENT_CLINIC_OR_DEPARTMENT_OTHER)
Admission: EM | Admit: 2021-09-10 | Discharge: 2021-09-10 | Disposition: A | Discharge status: Home / Self Care | Payer: Medicare Other | Attending: Emergency Medicine | Admitting: Emergency Medicine

## 2021-09-10 ENCOUNTER — Encounter (HOSPITAL_BASED_OUTPATIENT_CLINIC_OR_DEPARTMENT_OTHER): Payer: Self-pay

## 2021-09-10 DIAGNOSIS — Z91040 Latex allergy status: Secondary | ICD-10-CM | POA: Diagnosis not present

## 2021-09-10 DIAGNOSIS — M791 Myalgia, unspecified site: Secondary | ICD-10-CM | POA: Diagnosis not present

## 2021-09-10 DIAGNOSIS — M79604 Pain in right leg: Secondary | ICD-10-CM | POA: Insufficient documentation

## 2021-09-10 DIAGNOSIS — M79605 Pain in left leg: Secondary | ICD-10-CM | POA: Diagnosis not present

## 2021-09-10 DIAGNOSIS — R531 Weakness: Secondary | ICD-10-CM

## 2021-09-10 LAB — BASIC METABOLIC PANEL
Anion gap: 5 (ref 5–15)
BUN: 9 mg/dL (ref 6–20)
CO2: 26 mmol/L (ref 22–32)
Calcium: 9.5 mg/dL (ref 8.9–10.3)
Chloride: 109 mmol/L (ref 98–111)
Creatinine, Ser: 0.67 mg/dL (ref 0.44–1.00)
GFR, Estimated: >60 mL/min (ref >60)
Glucose, Bld: 89 mg/dL (ref 70–99)
Potassium: 3.8 mmol/L (ref 3.5–5.1)
Sodium: 140 mmol/L (ref 135–145)

## 2021-09-10 LAB — URINALYSIS, ROUTINE W REFLEX MICROSCOPIC
Bilirubin Urine: NEGATIVE
Glucose, UA: NEGATIVE mg/dL
Hgb urine dipstick: NEGATIVE
Ketones, ur: NEGATIVE mg/dL
Leukocytes,Ua: NEGATIVE
Nitrite: NEGATIVE
Protein, ur: NEGATIVE mg/dL
Specific Gravity, Urine: >=1.03 (ref 1.005–1.030)
pH: 5.5 (ref 5.0–8.0)

## 2021-09-10 LAB — CBC
HCT: 36.4 % (ref 36.0–46.0)
Hemoglobin: 12.8 g/dL (ref 12.0–15.0)
MCH: 26.2 pg (ref 26.0–34.0)
MCHC: 35.2 g/dL (ref 30.0–36.0)
MCV: 74.6 fL — ABNORMAL LOW (ref 80.0–100.0)
Platelets: 234 10*3/uL (ref 150–400)
RBC: 4.88 MIL/uL (ref 3.87–5.11)
RDW: 15.1 % (ref 11.5–15.5)
WBC: 7 10*3/uL (ref 4.0–10.5)
nRBC: 0 % (ref 0.0–0.2)

## 2021-09-10 NOTE — ED Triage Notes (Signed)
Gen weakness x 2 days - associated bilateral leg cramps.

## 2021-09-11 NOTE — ED Provider Notes (Signed)
[facility] EMERGENCY DEPARTMENT Provider Note   CSN: [id number] Arrival date & time: 09/10/21  1659     History  Chief Complaint  Patient presents with   Weakness    Kaylee Smith is a 51 y.o. female.  Patient complains of bilateral lower leg cramps and generalized weakness.  She reports symptoms began 2 days ago  The history is provided by the patient. No language interpreter was used.  Weakness Severity:  Moderate Onset quality:  Gradual Timing:  Constant Progression:  Worsening Relieved by:  Nothing Worsened by:  Nothing Ineffective treatments:  None tried Associated symptoms: myalgias   Associated symptoms: no dysuria, no lethargy, no loss of consciousness and no nausea        Home Medications Prior to Admission medications   Medication Sig Start Date End Date Taking? Authorizing Provider  ALPRAZolam Prudy Feeler) 1 MG tablet Take 1 mg by mouth daily as needed. 01/30/19  Yes [provider]  buPROPion (WELLBUTRIN) 100 MG tablet Take 100 mg by mouth 2 (two) times daily.   Yes [provider]  clonazePAM (KLONOPIN PO) Take by mouth. Pt unsure of dosage   Yes [provider]  hydrOXYzine (ATARAX/VISTARIL) 25 MG tablet Take 1 tablet (25 mg total) by mouth every 6 (six) hours as needed for anxiety. 11/22/18  Yes Fayrene Helper, PA-C  meloxicam (MOBIC) 15 MG tablet Take 15 mg by mouth daily as needed for muscle spasms. 10/31/17  Yes [provider]  Multiple Vitamin (MULTIVITAMIN) tablet Take 1 tablet by mouth daily.   Yes [provider]  sertraline (ZOLOFT) 25 MG tablet Take 25 mg by mouth daily. 11/23/17  Yes [provider]  SUMAtriptan (IMITREX) 20 MG/ACT nasal spray 1 spray into nostril.  May repeat in 2 hours if headache persists or recurs.  Maxium 2 sprays in 24 hours 04/09/19  Yes Jaffe, Adam R, DO  topiramate (TOPAMAX) 25 MG tablet TAKE 1 TABLET(25 MG) BY MOUTH AT BEDTIME 07/11/19  Yes Jaffe, Adam R, DO   traZODone (DESYREL) 50 MG tablet Take 50 mg by mouth at bedtime. 10/18/18  Yes [provider]  valsartan-hydrochlorothiazide (DIOVAN-HCT) 160-25 MG tablet Take 1 tablet by mouth daily.   Yes [provider]  potassium chloride SA (K-DUR,KLOR-CON) 20 MEQ tablet Take 2 tablets (40 mEq total) by mouth daily for 5 days. 11/27/17 04/09/19  Long, Arlyss Repress, MD  fluticasone (FLONASE) 50 MCG/ACT nasal spray Place 1 spray into both nostrils daily. Patient not taking: Reported on 11/26/2017 04/27/17 11/22/18  Petrucelli, Lelon Mast R, PA-C      Allergies    Latex and Penicillins    Review of Systems   Review of Systems  Gastrointestinal:  Negative for nausea.  Genitourinary:  Negative for dysuria.  Musculoskeletal:  Positive for myalgias.  Neurological:  Positive for weakness. Negative for loss of consciousness.  All other systems reviewed and are negative.   Physical Exam Updated Vital Signs BP 115/65   Pulse 63   Temp 98.4 F (36.9 C) (Oral)   Resp 17   Ht 5\' 4"  (1.626 m)   Wt 114.8 kg   LMP 11/17/2010   SpO2 94%   BMI 43.43 kg/m  Physical Exam Vitals and nursing note reviewed.  Constitutional:      Appearance: She is well-developed.  HENT:     Head: Normocephalic.  Cardiovascular:     Rate and Rhythm: Normal rate.  Pulmonary:     Effort: Pulmonary effort is normal.  Abdominal:  General: There is no distension.  Musculoskeletal:        General: Normal range of motion.     Cervical back: Normal range of motion.  Skin:    General: Skin is warm.  Neurological:     General: No focal deficit present.     Mental Status: She is alert and oriented to person, place, and time.     ED Results / Procedures / Treatments   Labs (all labs ordered are listed, but only abnormal results are displayed) Labs Reviewed  CBC - Abnormal; Notable for the following components:      Result Value   MCV 74.6 (*)    All other components within normal limits  BASIC METABOLIC  PANEL  URINALYSIS, ROUTINE W REFLEX MICROSCOPIC  CBG MONITORING, ED    EKG EKG Interpretation  Date/Time:  Friday September 10 2021 17:23:07 EDT Ventricular Rate:  83 PR Interval:  172 QRS Duration: 88 QT Interval:  330 QTC Calculation: 387 R Axis:   -28 Text Interpretation: Normal sinus rhythm Low voltage QRS Inferior infarct , age undetermined Anterolateral infarct , age undetermined Abnormal ECG No significant change since last tracing Confirmed by Melene Plan [id number]) on 09/10/2021 7:58:03 PM  Radiology DG Chest 2 View  Result Date: 09/10/2021 CLINICAL DATA:  Shortness of breath.  Weakness. EXAM: CHEST - 2 VIEW COMPARISON:  Chest radiograph dated 01/18/2021. FINDINGS: Mild eventration of left hemidiaphragm. No focal consolidation, pleural effusion, pneumothorax. The cardiac silhouette is within normal limits. No acute osseous pathology. IMPRESSION: No active cardiopulmonary disease. Electronically Signed   By: Elgie Collard M.D.   On: 09/10/2021 20:54    Procedures Procedures    Medications Ordered in ED Medications - No data to display  ED Course/ Medical Decision Making/ A&P                           Medical Decision Making Patient complains of weakness and leg cramps.  Amount and/or Complexity of Data Reviewed Independent Historian: spouse    Details: She is here with husband who is supportive External Data Reviewed: notes.    Details: Care notes are reviewed Labs: ordered. Decision-making details documented in ED Course.    Details: Abs ordered reviewed and interpreted CBC is normal urinalysis is negative be med is normal Radiology: ordered and independent interpretation performed. Decision-making details documented in ED Course.    Details: X-ray ordered reviewed and interpreted no acute cardiopulmonary disease ECG/medicine tests: ordered and independent interpretation performed. Decision-making details documented in ED Course.    Details: KG shows no acute  changes  Risk Risk Details: Patient counseled on results.  Patient encouraged to drink fluids she is advised to follow-up with her primary care physician for recheck           Final Clinical Impression(s) / ED Diagnoses Final diagnoses:  Weakness    Rx / DC Orders ED Discharge Orders     None      An After Visit Summary was printed and given to the patient.    Elson Areas, PA-C 09/12/21 0004    Melene Plan, DO 09/12/21 [phone]

## 2021-11-23 ENCOUNTER — Emergency Department (HOSPITAL_BASED_OUTPATIENT_CLINIC_OR_DEPARTMENT_OTHER)
Admission: EM | Admit: 2021-11-23 | Discharge: 2021-11-23 | Disposition: A | Discharge status: Home / Self Care | Payer: Medicare Other | Attending: Emergency Medicine | Admitting: Emergency Medicine

## 2021-11-23 ENCOUNTER — Other Ambulatory Visit: Payer: Self-pay

## 2021-11-23 ENCOUNTER — Encounter (HOSPITAL_BASED_OUTPATIENT_CLINIC_OR_DEPARTMENT_OTHER): Payer: Self-pay

## 2021-11-23 ENCOUNTER — Emergency Department (HOSPITAL_BASED_OUTPATIENT_CLINIC_OR_DEPARTMENT_OTHER): Payer: Medicare Other

## 2021-11-23 ENCOUNTER — Other Ambulatory Visit (HOSPITAL_BASED_OUTPATIENT_CLINIC_OR_DEPARTMENT_OTHER): Payer: Self-pay

## 2021-11-23 DIAGNOSIS — I10 Essential (primary) hypertension: Secondary | ICD-10-CM | POA: Diagnosis not present

## 2021-11-23 DIAGNOSIS — J690 Pneumonitis due to inhalation of food and vomit: Secondary | ICD-10-CM | POA: Diagnosis not present

## 2021-11-23 DIAGNOSIS — R079 Chest pain, unspecified: Secondary | ICD-10-CM | POA: Diagnosis present

## 2021-11-23 DIAGNOSIS — Z91040 Latex allergy status: Secondary | ICD-10-CM | POA: Insufficient documentation

## 2021-11-23 DIAGNOSIS — Z79899 Other long term (current) drug therapy: Secondary | ICD-10-CM | POA: Insufficient documentation

## 2021-11-23 LAB — COMPREHENSIVE METABOLIC PANEL
ALT: 12 U/L (ref 0–44)
AST: 13 U/L — ABNORMAL LOW (ref 15–41)
Albumin: 3.6 g/dL (ref 3.5–5.0)
Alkaline Phosphatase: 96 U/L (ref 38–126)
Anion gap: 3 — ABNORMAL LOW (ref 5–15)
BUN: 13 mg/dL (ref 6–20)
CO2: 27 mmol/L (ref 22–32)
Calcium: 8.7 mg/dL — ABNORMAL LOW (ref 8.9–10.3)
Chloride: 109 mmol/L (ref 98–111)
Creatinine, Ser: 0.83 mg/dL (ref 0.44–1.00)
GFR, Estimated: >60 mL/min (ref >60)
Glucose, Bld: 101 mg/dL — ABNORMAL HIGH (ref 70–99)
Potassium: 3.9 mmol/L (ref 3.5–5.1)
Sodium: 139 mmol/L (ref 135–145)
Total Bilirubin: 0.4 mg/dL (ref 0.3–1.2)
Total Protein: 7 g/dL (ref 6.5–8.1)

## 2021-11-23 LAB — CBC WITH DIFFERENTIAL/PLATELET
Abs Immature Granulocytes: 0.02 10*3/uL (ref 0.00–0.07)
Basophils Absolute: 0.1 10*3/uL (ref 0.0–0.1)
Basophils Relative: 1 %
Eosinophils Absolute: 0.2 10*3/uL (ref 0.0–0.5)
Eosinophils Relative: 3 %
HCT: 33.1 % — ABNORMAL LOW (ref 36.0–46.0)
Hemoglobin: 11.5 g/dL — ABNORMAL LOW (ref 12.0–15.0)
Immature Granulocytes: 0 %
Lymphocytes Relative: 34 %
Lymphs Abs: 2.9 10*3/uL (ref 0.7–4.0)
MCH: 26.1 pg (ref 26.0–34.0)
MCHC: 34.7 g/dL (ref 30.0–36.0)
MCV: 75.2 fL — ABNORMAL LOW (ref 80.0–100.0)
Monocytes Absolute: 0.4 10*3/uL (ref 0.1–1.0)
Monocytes Relative: 5 %
Neutro Abs: 4.9 10*3/uL (ref 1.7–7.7)
Neutrophils Relative %: 57 %
Platelets: 220 10*3/uL (ref 150–400)
RBC: 4.4 MIL/uL (ref 3.87–5.11)
RDW: 14.8 % (ref 11.5–15.5)
WBC: 8.5 10*3/uL (ref 4.0–10.5)
nRBC: 0 % (ref 0.0–0.2)

## 2021-11-23 LAB — D-DIMER, QUANTITATIVE: D-Dimer, Quant: 0.77 ug/mL-FEU — ABNORMAL HIGH (ref 0.00–0.50)

## 2021-11-23 LAB — TROPONIN I (HIGH SENSITIVITY): Troponin I (High Sensitivity): <2 ng/L (ref <18)

## 2021-11-23 MED ORDER — IOHEXOL 350 MG/ML SOLN
100.0000 mL | Freq: Once | INTRAVENOUS | Status: AC | PRN
  Administered 2021-11-23: 100 mL via INTRAVENOUS

## 2021-11-23 MED ORDER — CLINDAMYCIN HCL 300 MG PO CAPS
300.0000 mg | ORAL_CAPSULE | Freq: Three times a day (TID) | ORAL | 0 refills | Status: DC
  Filled 2021-11-23: qty 15, 5d supply, fill #0

## 2021-11-23 NOTE — Discharge Instructions (Signed)
It looks like when you had the episode of vomiting some of your vomit went down and got in your lungs and has caused a little bit of pneumonia.  You were given a prescription for an antibiotic in case there is a small bacterial component but your symptoms should improve with time.  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  If you start having worsening symptoms such as a high fever, inability to catch her breath or other concerns return to the emergency room.

## 2021-11-23 NOTE — ED Triage Notes (Signed)
Pt reports when she laid down to go to sleep 2 days ago and vomited, Now when she lays down at night chest hurts when breaths. Pain wakes pt up at night. No further vomiting. Denies coughing

## 2021-11-23 NOTE — ED Provider Notes (Signed)
[facility] EMERGENCY DEPARTMENT Provider Note   CSN: [id number] Arrival date & time: 11/23/21  0845     History  Chief Complaint  Patient presents with   Chest Pain    Kaylee Smith is a 51 y.o. female.  Patient is a 51 year old female with a history of hypertension, prior gastric sleeve, anxiety and depression who is presenting today with complaint of chest pain.  Patient reports symptoms started about 2 days ago.  She says that she had water to drink and ate some dinner and then lay down to go to sleep and shortly after lying down she had an episode of vomiting.  She reports coughing and choking with the emesis and was concerned she may have aspirated a little bit.  Since that time she has been complaining of pain with inspiration in the center of her chest.  She has not had any ongoing coughing, shortness of breath or abdominal pain.  She has been able to continue eating and drinking and has had no further episodes of emesis.  She has tried taking aspirin and Motrin but reports is not really made the pleuritic pain any better.  She does not have a history of asthma does not use any inhalers and denies any tobacco use.  She has not had any recent surgeries, unilateral leg pain or swelling or fevers.  She has no known heart disease.  However because her symptoms were not improving she became concerned and came for evaluation.  The history is provided by the patient.  Chest Pain      Home Medications Prior to Admission medications   Medication Sig Start Date End Date Taking? Authorizing Provider  ALPRAZolam Duanne Moron) 1 MG tablet Take 1 mg by mouth daily as needed. 01/30/19  Yes [provider]  buPROPion (WELLBUTRIN) 100 MG tablet Take 100 mg by mouth 2 (two) times daily.   Yes [provider]  clindamycin (CLEOCIN) 300 MG capsule Take 1 capsule (300 mg total) by mouth 3 (three) times daily. 11/23/21  Yes Blanchie Dessert, MD  clonazePAM (KLONOPIN PO) Take by  mouth. Pt unsure of dosage   Yes [provider]  sertraline (ZOLOFT) 25 MG tablet Take 25 mg by mouth daily. 11/23/17  Yes [provider]  topiramate (TOPAMAX) 25 MG tablet TAKE 1 TABLET(25 MG) BY MOUTH AT BEDTIME 07/11/19  Yes Jaffe, Adam R, DO  traZODone (DESYREL) 50 MG tablet Take 50 mg by mouth at bedtime. 10/18/18  Yes [provider]  valsartan-hydrochlorothiazide (DIOVAN-HCT) 160-25 MG tablet Take 1 tablet by mouth daily.   Yes [provider]  hydrOXYzine (ATARAX/VISTARIL) 25 MG tablet Take 1 tablet (25 mg total) by mouth every 6 (six) hours as needed for anxiety. 11/22/18   Domenic Moras, PA-C  meloxicam (MOBIC) 15 MG tablet Take 15 mg by mouth daily as needed for muscle spasms. 10/31/17   [provider]  Multiple Vitamin (MULTIVITAMIN) tablet Take 1 tablet by mouth daily.    [provider]  potassium chloride SA (K-DUR,KLOR-CON) 20 MEQ tablet Take 2 tablets (40 mEq total) by mouth daily for 5 days. 11/27/17 04/09/19  Long, Wonda Olds, MD  SUMAtriptan (IMITREX) 20 MG/ACT nasal spray 1 spray into nostril.  May repeat in 2 hours if headache persists or recurs.  Maxium 2 sprays in 24 hours 04/09/19   Tomi Likens, Adam R, DO  fluticasone (FLONASE) 50 MCG/ACT nasal spray Place 1 spray into both nostrils daily. Patient not taking: Reported on 11/26/2017 04/27/17 11/22/18  Petrucelli, Samantha R, PA-C      Allergies    Latex and Penicillins    Review of Systems   Review of Systems  Cardiovascular:  Positive for chest pain.    Physical Exam Updated Vital Signs BP (!) 112/56   Pulse 65   Temp 97.9 F (36.6 C)   Resp 20   Ht 5\' 4"  (1.626 m)   Wt 113.4 kg   LMP 11/17/2010   SpO2 94%   BMI 42.91 kg/m  Physical Exam Vitals and nursing note reviewed.  Constitutional:      General: She is not in acute distress.    Appearance: She is well-developed.  HENT:     Head: Normocephalic and atraumatic.  Eyes:     Pupils: Pupils are equal, round, and  reactive to light.  Cardiovascular:     Rate and Rhythm: Normal rate and regular rhythm.     Heart sounds: Normal heart sounds. No murmur heard.    No friction rub.  Pulmonary:     Effort: Pulmonary effort is normal.     Breath sounds: Normal breath sounds. No wheezing or rales.     Comments: No reproducible chest pain Abdominal:     General: Bowel sounds are normal. There is no distension.     Palpations: Abdomen is soft.     Tenderness: There is no abdominal tenderness. There is no guarding or rebound.  Musculoskeletal:        General: No tenderness. Normal range of motion.     Comments: No edema.  No calf pain or swelling  Skin:    General: Skin is warm and dry.     Findings: No rash.  Neurological:     Mental Status: She is alert and oriented to person, place, and time. Mental status is at baseline.     Cranial Nerves: No cranial nerve deficit.  Psychiatric:        Mood and Affect: Mood normal.        Behavior: Behavior normal.     ED Results / Procedures / Treatments   Labs (all labs ordered are listed, but only abnormal results are displayed) Labs Reviewed  CBC WITH DIFFERENTIAL/PLATELET - Abnormal; Notable for the following components:      Result Value   Hemoglobin 11.5 (*)    HCT 33.1 (*)    MCV 75.2 (*)    All other components within normal limits  COMPREHENSIVE METABOLIC PANEL - Abnormal; Notable for the following components:   Glucose, Bld 101 (*)    Calcium 8.7 (*)    AST 13 (*)    Anion gap 3 (*)    All other components within normal limits  D-DIMER, QUANTITATIVE - Abnormal; Notable for the following components:   D-Dimer, Quant 0.77 (*)    All other components within normal limits  TROPONIN I (HIGH SENSITIVITY)  TROPONIN I (HIGH SENSITIVITY)    EKG EKG Interpretation  Date/Time:  Tuesday November 23 2021 09:08:23 EDT Ventricular Rate:  71 PR Interval:  148 QRS Duration: 102 QT Interval:  394 QTC Calculation: 429 R Axis:   -21 Text  Interpretation: Sinus rhythm Inferior infarct, old Probable anteroseptal infarct, old No significant change since last tracing Confirmed by Blanchie Dessert [phone]) on 11/23/2021 9:15:32 AM  Radiology CT Angio Chest PE W and/or Wo Contrast  Result Date: 11/23/2021 CLINICAL DATA:  Chest pain. Pulmonary embolism (PE) suspected, positive D-dimer EXAM: CT ANGIOGRAPHY CHEST WITH CONTRAST TECHNIQUE: Multidetector CT imaging of the chest  was performed using the standard protocol during bolus administration of intravenous contrast. Multiplanar CT image reconstructions and MIPs were obtained to evaluate the vascular anatomy. RADIATION DOSE REDUCTION: This exam was performed according to the departmental dose-optimization program which includes automated exposure control, adjustment of the mA and/or kV according to patient size and/or use of iterative reconstruction technique. CONTRAST:  179mL OMNIPAQUE IOHEXOL 350 MG/ML SOLN COMPARISON:  08/25/2019 FINDINGS: Cardiovascular: Examination is degraded by respiratory motion artifact and beam hardening artifact related to patient body habitus. Within this limitation, there is no evidence of pulmonary embolism to the segmental branch level. Heart size is normal. No pericardial effusion. Thoracic aorta is nonaneurysmal. Mediastinum/Nodes: No enlarged mediastinal, hilar, or axillary lymph nodes. Thyroid gland, trachea, and esophagus demonstrate no significant findings. Small hiatal hernia. Lungs/Pleura: Multifocal areas of patchy ground-glass opacity within the left upper lobe and left lower lobe. Right lung is clear. No pleural effusion or pneumothorax. Upper Abdomen: No acute abnormality. Musculoskeletal: No chest wall abnormality. No acute or significant osseous findings. Review of the MIP images confirms the above findings. IMPRESSION: 1. No evidence of pulmonary embolism to the segmental branch level. 2. Multifocal areas of patchy ground-glass opacity within the left  upper lobe and left lower lobe, suggestive of an infectious or inflammatory process. 3. Small hiatal hernia. Electronically Signed   By: Davina Poke D.O.   On: 11/23/2021 10:58   DG Chest Port 1 View  Result Date: 11/23/2021 CLINICAL DATA:  Provided history: Chest pain. EXAM: PORTABLE CHEST 1 VIEW COMPARISON:  Prior chest radiographs 09/10/2021 and earlier. FINDINGS: Shallow inspiration radiograph. Mild chronic elevation of the left hemidiaphragm, unchanged. Heart size within normal limits. Ill-defined opacity within the bilateral lung bases (left greater than right). No evidence of pleural effusion or pneumothorax. No acute bony abnormality identified. IMPRESSION: Shallow inspiration radiograph. Ill-defined opacities within the bilateral lung bases (left greater than right). While this at least partially reflects atelectasis, superimposed pneumonia cannot be excluded. Mild chronic elevation of the left hemidiaphragm, unchanged. Electronically Signed   By: Kellie Simmering D.O.   On: 11/23/2021 10:04    Procedures Procedures    Medications Ordered in ED Medications  iohexol (OMNIPAQUE) 350 MG/ML injection 100 mL (100 mLs Intravenous Contrast Given 11/23/21 1025)    ED Course/ Medical Decision Making/ A&P                           Medical Decision Making Amount and/or Complexity of Data Reviewed External Data Reviewed: notes. Labs: ordered. Decision-making details documented in ED Course. Radiology: ordered and independent interpretation performed. Decision-making details documented in ED Course. ECG/medicine tests: ordered and independent interpretation performed. Decision-making details documented in ED Course.  Risk Prescription drug management.   Pt with multiple medical problems and comorbidities and presenting today with a complaint that caries a high risk for morbidity and mortality.  Here today with pleuritic type chest pain.  This is in the setting of symptoms occurred after an  episode of vomiting and possible some mild aspiration.  Low suspicion for pneumothorax, dissection, ACS.  Patient is low risk Wells and we will do a D-dimer.  Symptoms occurred 2 days ago and low suspicion for Boerhaave's as patient appears quite well.  Low suspicion for an acute abdominal process at this time as patient has continued to eat and drink and has not had worsening of her symptoms with that.  I independently interpreted patient's EKG and labs. EKG shows a  sinus rhythm without acute findings, CBC, CMP, trop are within normal limits.  D-dimer is elevated at 0.77 and we will do a CTA for further evaluation. I have independently visualized and interpreted pt's images today. Poor inspiratory film but no evidence of acute infiltrate.  12:15 PM CTA was negative for PE but did show multifocal areas of patchy groundglass opacity within the left upper lobe and left lower lobe suggestive of infectious or inflammatory process.  She did report an episode of emesis before her symptoms started and concern for possible aspiration.  Patient sats are greater than 90% on room air and she is denying any shortness of breath.  She has not had any fever and is otherwise well-appearing.  Feel that patient would benefit from antibiotic coverage and will use clindamycin as she has a penicillin allergy.  She was given return precautions.  Also cautioned about using NSAIDs or aspirin with her prior sleeve.  Patient is comfortable with this plan.  No indication for admission at this time.         Final Clinical Impression(s) / ED Diagnoses Final diagnoses:  Aspiration pneumonia of both lungs due to vomit, unspecified part of lung ([place])    Rx / DC Orders ED Discharge Orders          Ordered    clindamycin (CLEOCIN) 300 MG capsule  3 times daily        11/23/21 1212              Blanchie Dessert, MD 11/23/21 1215

## 2022-02-04 ENCOUNTER — Emergency Department (HOSPITAL_BASED_OUTPATIENT_CLINIC_OR_DEPARTMENT_OTHER)
Admission: EM | Admit: 2022-02-04 | Discharge: 2022-02-04 | Disposition: A | Discharge status: Home / Self Care | Payer: Medicare Other | Attending: Emergency Medicine | Admitting: Emergency Medicine

## 2022-02-04 ENCOUNTER — Other Ambulatory Visit: Payer: Self-pay

## 2022-02-04 ENCOUNTER — Emergency Department (HOSPITAL_BASED_OUTPATIENT_CLINIC_OR_DEPARTMENT_OTHER): Payer: Medicare Other

## 2022-02-04 ENCOUNTER — Encounter (HOSPITAL_BASED_OUTPATIENT_CLINIC_OR_DEPARTMENT_OTHER): Payer: Self-pay

## 2022-02-04 DIAGNOSIS — Z79899 Other long term (current) drug therapy: Secondary | ICD-10-CM | POA: Diagnosis not present

## 2022-02-04 DIAGNOSIS — I10 Essential (primary) hypertension: Secondary | ICD-10-CM | POA: Diagnosis not present

## 2022-02-04 DIAGNOSIS — R1031 Right lower quadrant pain: Secondary | ICD-10-CM | POA: Diagnosis present

## 2022-02-04 LAB — CBC WITH DIFFERENTIAL/PLATELET
Abs Immature Granulocytes: 0.01 10*3/uL (ref 0.00–0.07)
Basophils Absolute: 0.1 10*3/uL (ref 0.0–0.1)
Basophils Relative: 1 %
Eosinophils Absolute: 0.2 10*3/uL (ref 0.0–0.5)
Eosinophils Relative: 2 %
HCT: 35.1 % — ABNORMAL LOW (ref 36.0–46.0)
Hemoglobin: 12.2 g/dL (ref 12.0–15.0)
Immature Granulocytes: 0 %
Lymphocytes Relative: 49 %
Lymphs Abs: 3.5 10*3/uL (ref 0.7–4.0)
MCH: 26.2 pg (ref 26.0–34.0)
MCHC: 34.8 g/dL (ref 30.0–36.0)
MCV: 75.3 fL — ABNORMAL LOW (ref 80.0–100.0)
Monocytes Absolute: 0.5 10*3/uL (ref 0.1–1.0)
Monocytes Relative: 7 %
Neutro Abs: 3 10*3/uL (ref 1.7–7.7)
Neutrophils Relative %: 41 %
Platelets: 199 10*3/uL (ref 150–400)
RBC: 4.66 MIL/uL (ref 3.87–5.11)
RDW: 15 % (ref 11.5–15.5)
WBC: 7.2 10*3/uL (ref 4.0–10.5)
nRBC: 0 % (ref 0.0–0.2)

## 2022-02-04 LAB — LIPASE, BLOOD: Lipase: 58 U/L — ABNORMAL HIGH (ref 11–51)

## 2022-02-04 LAB — COMPREHENSIVE METABOLIC PANEL
ALT: 12 U/L (ref 0–44)
AST: 16 U/L (ref 15–41)
Albumin: 3.9 g/dL (ref 3.5–5.0)
Alkaline Phosphatase: 110 U/L (ref 38–126)
Anion gap: 8 (ref 5–15)
BUN: 11 mg/dL (ref 6–20)
CO2: 26 mmol/L (ref 22–32)
Calcium: 9 mg/dL (ref 8.9–10.3)
Chloride: 105 mmol/L (ref 98–111)
Creatinine, Ser: 0.89 mg/dL (ref 0.44–1.00)
GFR, Estimated: >60 mL/min (ref >60)
Glucose, Bld: 88 mg/dL (ref 70–99)
Potassium: 3.4 mmol/L — ABNORMAL LOW (ref 3.5–5.1)
Sodium: 139 mmol/L (ref 135–145)
Total Bilirubin: 0.3 mg/dL (ref 0.3–1.2)
Total Protein: 7.2 g/dL (ref 6.5–8.1)

## 2022-02-04 LAB — URINALYSIS, ROUTINE W REFLEX MICROSCOPIC
Bilirubin Urine: NEGATIVE
Glucose, UA: NEGATIVE mg/dL
Hgb urine dipstick: NEGATIVE
Ketones, ur: NEGATIVE mg/dL
Leukocytes,Ua: NEGATIVE
Nitrite: NEGATIVE
Protein, ur: NEGATIVE mg/dL
Specific Gravity, Urine: 1.02 (ref 1.005–1.030)
pH: 7 (ref 5.0–8.0)

## 2022-02-04 MED ORDER — IOHEXOL 300 MG/ML  SOLN
100.0000 mL | Freq: Once | INTRAMUSCULAR | Status: AC | PRN
Start: 2022-02-04 — End: 2022-02-04
  Administered 2022-02-04: 100 mL via INTRAVENOUS

## 2022-02-04 NOTE — ED Triage Notes (Signed)
Pt reports right lower quadrant pain x1 week.  Seen at urgent care, did abdominal exam, sent over for evaluation to r/o appendicitis.   Denies fever, denies n/v. Had weight loss surgery in November with expected recovery.

## 2022-02-04 NOTE — ED Provider Notes (Signed)
[facility] EMERGENCY DEPARTMENT Provider Note   CSN: [id number] Arrival date & time: 02/04/22  1636     History  Chief Complaint  Patient presents with   Abdominal Pain    Kaylee Smith is a 52 y.o. female.  52 y.o female with a PMH HTN, Anxiety presents to the ED with a chief complaint of right lower abdominal pain which has been ongoing for the past week.  Described as a cramping sensation localized to the right lower quadrant.  Patient has taken Tylenol, ibuprofen, Motrin however reports there has not been any improvement in symptoms.  In addition she has taken cranberry juice, symptoms are somewhat alleviated with laying on the opposite side.  She does endorse nausea and vomiting however this is at her baseline as she had a previous history of a gastric sleeve.  She reports she usually vomits if she does not sit up for 30 seconds after she eats.  Denies any urinary symptoms, no alcohol intake, no prior history of diabetes.  No urinary symptoms, no fever.  Of note, upon further questioning patient reports that she has had some loose stool for the last couple of days.  The history is provided by the patient and medical records.  Abdominal Pain Associated symptoms: nausea and vomiting   Associated symptoms: no chest pain, no chills, no fever, no shortness of breath and no sore throat        Home Medications Prior to Admission medications   Medication Sig Start Date End Date Taking? Authorizing Provider  ALPRAZolam Duanne Moron) 1 MG tablet Take 1 mg by mouth daily as needed. 01/30/19   [provider]  buPROPion (WELLBUTRIN) 100 MG tablet Take 100 mg by mouth 2 (two) times daily.    [provider]  clindamycin (CLEOCIN) 300 MG capsule Take 1 capsule (300 mg total) by mouth 3 (three) times daily. 11/23/21   Blanchie Dessert, MD  clonazePAM (KLONOPIN PO) Take by mouth. Pt unsure of dosage    [provider]  hydrOXYzine (ATARAX/VISTARIL) 25 MG tablet  Take 1 tablet (25 mg total) by mouth every 6 (six) hours as needed for anxiety. 11/22/18   Domenic Moras, PA-C  meloxicam (MOBIC) 15 MG tablet Take 15 mg by mouth daily as needed for muscle spasms. 10/31/17   [provider]  Multiple Vitamin (MULTIVITAMIN) tablet Take 1 tablet by mouth daily.    [provider]  potassium chloride SA (K-DUR,KLOR-CON) 20 MEQ tablet Take 2 tablets (40 mEq total) by mouth daily for 5 days. 11/27/17 04/09/19  Long, Wonda Olds, MD  sertraline (ZOLOFT) 25 MG tablet Take 25 mg by mouth daily. 11/23/17   [provider]  SUMAtriptan (IMITREX) 20 MG/ACT nasal spray 1 spray into nostril.  May repeat in 2 hours if headache persists or recurs.  Maxium 2 sprays in 24 hours 04/09/19   Metta Clines R, DO  topiramate (TOPAMAX) 25 MG tablet TAKE 1 TABLET(25 MG) BY MOUTH AT BEDTIME 07/11/19   Tomi Likens, Adam R, DO  traZODone (DESYREL) 50 MG tablet Take 50 mg by mouth at bedtime. 10/18/18   [provider]  valsartan-hydrochlorothiazide (DIOVAN-HCT) 160-25 MG tablet Take 1 tablet by mouth daily.    [provider]  fluticasone (FLONASE) 50 MCG/ACT nasal spray Place 1 spray into both nostrils daily. Patient not taking: Reported on 11/26/2017 04/27/17 11/22/18  Petrucelli, Aldona Bar R, PA-C      Allergies    Latex and Penicillins    Review of Systems  Review of Systems  Constitutional:  Negative for chills and fever.  HENT:  Negative for sore throat.   Respiratory:  Negative for shortness of breath.   Cardiovascular:  Negative for chest pain.  Gastrointestinal:  Positive for abdominal pain, nausea and vomiting.  Genitourinary:  Negative for flank pain.  Musculoskeletal:  Negative for back pain.  Neurological:  Negative for facial asymmetry and light-headedness.  All other systems reviewed and are negative.   Physical Exam Updated Vital Signs BP 128/82   Pulse 62   Temp 98.3 F (36.8 C) (Oral)   Resp 18   Ht 5\' 4"  (1.626 m)   Wt 114.8 kg    LMP 11/17/2010   SpO2 98%   BMI 43.43 kg/m  Physical Exam Vitals and nursing note reviewed.  Constitutional:      General: She is not in acute distress.    Appearance: She is well-developed.  HENT:     Head: Normocephalic and atraumatic.     Mouth/Throat:     Pharynx: No oropharyngeal exudate.  Eyes:     Pupils: Pupils are equal, round, and reactive to light.  Cardiovascular:     Rate and Rhythm: Regular rhythm.     Heart sounds: Normal heart sounds.  Pulmonary:     Effort: Pulmonary effort is normal. No respiratory distress.     Breath sounds: Normal breath sounds.  Abdominal:     General: Bowel sounds are normal. There is no distension.     Palpations: Abdomen is soft.     Tenderness: There is no abdominal tenderness.  Musculoskeletal:        General: No tenderness or deformity.     Cervical back: Normal range of motion.     Right lower leg: No edema.     Left lower leg: No edema.  Skin:    General: Skin is warm and dry.  Neurological:     Mental Status: She is alert and oriented to person, place, and time.     ED Results / Procedures / Treatments   Labs (all labs ordered are listed, but only abnormal results are displayed) Labs Reviewed  CBC WITH DIFFERENTIAL/PLATELET - Abnormal; Notable for the following components:      Result Value   HCT 35.1 (*)    MCV 75.3 (*)    All other components within normal limits  COMPREHENSIVE METABOLIC PANEL - Abnormal; Notable for the following components:   Potassium 3.4 (*)    All other components within normal limits  LIPASE, BLOOD - Abnormal; Notable for the following components:   Lipase 58 (*)    All other components within normal limits  URINALYSIS, ROUTINE W REFLEX MICROSCOPIC    EKG None  Radiology CT ABDOMEN PELVIS W CONTRAST  Result Date: 02/04/2022 CLINICAL DATA:  Right lower quadrant abdominal pain for 1 week, previous bariatric surgery EXAM: CT ABDOMEN AND PELVIS WITH CONTRAST TECHNIQUE: Multidetector CT  imaging of the abdomen and pelvis was performed using the standard protocol following bolus administration of intravenous contrast. RADIATION DOSE REDUCTION: This exam was performed according to the departmental dose-optimization program which includes automated exposure control, adjustment of the mA and/or kV according to patient size and/or use of iterative reconstruction technique. CONTRAST:  04/05/2022 OMNIPAQUE IOHEXOL 300 MG/ML  SOLN COMPARISON:  01/25/2017 FINDINGS: Lower chest: No acute pleural or parenchymal lung disease. Hepatobiliary: 6 mm hypodensity inferior right lobe liver reference image 31/2 too small to characterize, but statistically likely representing a cyst. Otherwise the liver and  gallbladder are unremarkable. No biliary duct dilation. Pancreas: Unremarkable. No pancreatic ductal dilatation or surrounding inflammatory changes. Spleen: Normal in size without focal abnormality. Adrenals/Urinary Tract: No urinary tract calculi or obstructive uropathy within either kidney. Kidneys enhance normally and symmetrically. The adrenals and bladder are unremarkable. Stomach/Bowel: No bowel obstruction or ileus. Normal appendix is seen within the right lower quadrant measuring up to 7 mm in diameter. No inflammatory changes to suggest acute appendicitis. No bowel wall thickening. Postsurgical changes are seen from prior bariatric surgery. Vascular/Lymphatic: No significant vascular findings are present. No enlarged abdominal or pelvic lymph nodes. Reproductive: Status post hysterectomy. No adnexal masses. Other: No free fluid or free intraperitoneal gas. No abdominal wall hernia. Chronic scarring lower anterior abdominal wall consistent with prior hysterectomy. Musculoskeletal: No acute or destructive bony lesions. Severe progressive bilateral hip osteoarthritis, left greater than right. Reconstructed images demonstrate no additional findings. IMPRESSION: 1. Normal appendix right lower quadrant. No evidence of  acute appendicitis. 2. No acute intra-abdominal or intrapelvic process. 3. Postsurgical changes from prior bariatric surgery. Electronically Signed   By: Sharlet Salina M.D.   On: 02/04/2022 20:56    Procedures Procedures    Medications Ordered in ED Medications  iohexol (OMNIPAQUE) 300 MG/ML solution 100 mL (100 mLs Intravenous Contrast Given 02/04/22 2036)    ED Course/ Medical Decision Making/ A&P Clinical Course as of 02/04/22 2311  Fri Feb 04, 2022  2300 CT ABDOMEN PELVIS W CONTRAST [JS]    Clinical Course User Index [JS] Claude Manges, PA-C                             Medical Decision Making Amount and/or Complexity of Data Reviewed Labs: ordered. Radiology: ordered.  Risk Prescription drug management.    This patient presents to the ED for concern of lower abdominal pain, this involves a number of treatment options, and is a complaint that carries with it a high risk of complications and morbidity.  The differential diagnosis includes discitis, diverticulitis, variant torsion versus viral illness.   Co morbidities: Discussed in HPI   Brief History:  Patient here with lower abdominal pain that is been ongoing for the past week, taking over-the-counter medication without any improvement in symptoms.  Evaluated urgent care today and sent to the ED in order to rule out appendicitis.  EMR reviewed including pt PMHx, past surgical history and past visits to ER.   See HPI for more details   Lab Tests:  I ordered and independently interpreted labs.  The pertinent results include:    I personally reviewed all laboratory work and imaging. Metabolic panel without any acute abnormality specifically kidney function within normal limits and no significant electrolyte abnormalities. CBC without leukocytosis or significant anemia.  Slight elevation of her lipase, no prior history of diabetes, denies any alcohol use.   Imaging Studies:  CT Abdomen and pelvis: IMPRESSION:   1. Normal appendix right lower quadrant. No evidence of acute  appendicitis.  2. No acute intra-abdominal or intrapelvic process.  3. Postsurgical changes from prior bariatric surgery.    Medicines ordered:  No medications were ordered as at the time of my evaluation patient reported her pain had resolved without any intervention.  Reevaluation:  After the interventions noted above I re-evaluated patient and found that they have :resolved   Social Determinants of Health:  The patient's social determinants of health were a factor in the care of this patient  Problem List /  ED Course:  Patient presents to the ED with a chief complaint of lower abdominal pain has been ongoing for the past week.  Patient has taken over-the-counter medication without any improvement in symptoms.  She does have a prior history of gastric sleeve, reports nausea and vomiting occurred at baseline.  She has not had any improvement in symptoms despite over-the-counter medication.  Evaluated urgent care and sent to the emergency department in order to rule out appendicitis.  Evaluation there is no tenderness with palpation, no guarding, no rebound noted.  No CVA tenderness and she denies any urinary symptoms.  Her UA is without any signs of infection.  She denies any vaginal discharge, vaginal bleeding.  Low suspicion for torsion as patient had a total hysterectomy.  CMP with no electrolyte derangement, current levels unremarkable.  LFTs are within normal limits, no palpable pain along the right upper quadrant.  Most of her pain is concentrated at the right pelvic area.  I do not suspect cholecystitis at this time.  She is afebrile, normotensive.  Patient was slightly elevated, however she denies a history of pancreatitis, no alcohol use.  CT of her abdomen and pelvis did not show any appendicitis, without any acute finding.  At the time of my assessment with patient she denies having any symptoms at this time, she does  not want anything for pain control.  I did discuss with her appropriate follow-up with primary care physician.  She was offered pain medication to help with symptoms, however patient prefers to obtain over-the-counter medication.  Labs are reassuring, CT is unremarkable I do feel that patient is stable for outpatient follow-up.  Patient discharged from the ED in stable condition.   Dispostion:  After consideration of the diagnostic results and the patients response to treatment, I feel that the patent would benefit from outpatient follow up with primary care physician.     Portions of this note were generated with [employer]. Dictation errors may occur despite best attempts at proofreading.   Final Clinical Impression(s) / ED Diagnoses Final diagnoses:  Right lower quadrant abdominal pain    Rx / DC Orders ED Discharge Orders     None         Janeece Fitting, PA-C 02/04/22 2311    Gareth Morgan, MD 02/05/22 1123

## 2022-02-04 NOTE — Discharge Instructions (Addendum)
You may take any over-the-counter anti-inflammatory neuro to help with your symptoms.  The CT of your abdomen did not show any acute findings on today's visit.

## 2023-01-31 ENCOUNTER — Ambulatory Visit: Payer: 59 | Admitting: Internal Medicine

## 2023-03-14 ENCOUNTER — Other Ambulatory Visit: Payer: Self-pay

## 2023-03-14 ENCOUNTER — Encounter: Payer: Self-pay | Admitting: Internal Medicine

## 2023-03-14 ENCOUNTER — Ambulatory Visit (INDEPENDENT_AMBULATORY_CARE_PROVIDER_SITE_OTHER): Payer: 59 | Admitting: Internal Medicine

## 2023-03-14 VITALS — BP 132/88 | HR 76 | Temp 98.1°F | Resp 20 | Ht 64.0 in | Wt 274.8 lb

## 2023-03-14 DIAGNOSIS — I10 Essential (primary) hypertension: Secondary | ICD-10-CM

## 2023-03-14 DIAGNOSIS — K219 Gastro-esophageal reflux disease without esophagitis: Secondary | ICD-10-CM

## 2023-03-14 DIAGNOSIS — J3089 Other allergic rhinitis: Secondary | ICD-10-CM

## 2023-03-14 DIAGNOSIS — G43119 Migraine with aura, intractable, without status migrainosus: Secondary | ICD-10-CM | POA: Diagnosis not present

## 2023-03-14 MED ORDER — MONTELUKAST SODIUM 10 MG PO TABS: 10.0000 mg | ORAL_TABLET | Freq: Every day | ORAL | 1 refills | Status: DC

## 2023-03-14 MED ORDER — AZELASTINE HCL 0.1 % NA SOLN: 2.0000 | Freq: Two times a day (BID) | NASAL | 12 refills | Status: AC

## 2023-03-14 NOTE — Patient Instructions (Addendum)
Chronic Rhinitis  with migraines  : - Return for allergy testing (1-55) Hold cetrizine/zyrtec for 3 days prior   - Prevention:  - allergen avoidance when possible - consider allergy shots as long term control of your symptoms by teaching your immune system to be more tolerant of your allergy triggers  - Symptom control: - Continue Nasal Steroid Spray: Best results if used daily. - Options include Flonase (fluticasone), Nasocort (triamcinolone), Nasonex (mometasome) 1- 2 sprays in each nostril daily.  - All can be purchased over-the-counter if not covered by insurance. - Start Astelin (azelastine) 1-2 sprays in each nostril twice a day as needed for nasal congestion/itchy nose. - Start Singulair (Montelukast) 10mg  nightly.   - Discontinue if nightmares of behavior changes. - Continue Antihistamine: daily or daily as needed.   -Options include Zyrtec (Cetirizine) 10mg , Claritin (Loratadine) 10mg , Allegra (Fexofenadine) 180mg , or Xyzal (Levocetirinze) 5mg  - Can be purchased over-the-counter if not covered by insurance.  Migraines Chronic migraines managed with monthly injections and rizatriptan as needed. No clear association with sinus/allergy symptoms. -Continue current migraine management plan.  Gastroesophageal Reflux Disease (GERD) History of GERD, but no current symptoms reported after weight loss surgery. -No changes to current management plan.  Follow up: for allergy testing (1-55)   Thank you so much for letting me partake in your care today.  Don't hesitate to reach out if you have any additional concerns!  Ferol Luz, MD  [facility] [place], [place]

## 2023-03-14 NOTE — Progress Notes (Unsigned)
NEW PATIENT Date of Service/Encounter:  03/15/23 Referring provider: none-self referred Primary care provider: Pcp, No  Subjective:  Kaylee Smith is a 53 y.o. female  presenting today for evaluation of chronic rhinitis and headaches  History obtained from: chart review and patient.   Discussed the use of AI scribe software for clinical note transcription with the patient, who gave verbal consent to proceed.  History of Present Illness   Kaylee Smith is a 53 year old female with recurrent sinus infections and migraines who presents with concerns about allergies.  She has been experiencing recurrent sinus infections for several years, characterized by excruciating headaches located in the sinus area and nasal congestion. Symptoms resolve but recur within one to two weeks. She suspects mold, dust, or her dog may contribute to her symptoms, noting flare-ups when around her dog and potential mold issues in her old house. Antibiotics have been prescribed, providing temporary relief, but symptoms return post-treatment. She has declined steroid treatments due to concerns about weight gain, which she is managing in preparation for bilateral hip replacement.  Her history of migraines is managed with monthly injections  (biologics?) and rizatriptan as needed. There are no associated runny or stuffy nose, or itchy, watery eyes with her migraines.  Her current allergy medications include Zyrtec and Flonase, though she does not take Zyrtec daily due to forgetfulness. Symptoms persist regardless of consistent medication use.  She lives in an old house and owns a dog. No issues with sense of smell or taste. History of heartburn, but no current symptoms.     She also has a history of GERD which significantly improved with weight loss surgery.  Since then she has not had any symptoms.    Other allergy screening: Asthma: no Rhino conjunctivitis: yes Food allergy: no Medication allergy:  yes Hymenoptera allergy: no Urticaria: no Eczema:no History of recurrent infections suggestive of immunodeficency: no Vaccinations are up to date.   Past Medical History: Past Medical History:  Diagnosis Date   Anxiety    Depressed    Hypertension    Medication List:  Current Outpatient Medications  Medication Sig Dispense Refill   ALPRAZolam (XANAX) 1 MG tablet Take 1 mg by mouth daily as needed.     azelastine (ASTELIN) 0.1 % nasal spray Place 2 sprays into both nostrils 2 (two) times daily. Use in each nostril as directed 30 mL 12   buPROPion (WELLBUTRIN) 100 MG tablet Take 100 mg by mouth 2 (two) times daily.     clindamycin (CLEOCIN) 300 MG capsule Take 1 capsule (300 mg total) by mouth 3 (three) times daily. 15 capsule 0   clonazePAM (KLONOPIN PO) Take by mouth. Pt unsure of dosage     hydrOXYzine (ATARAX/VISTARIL) 25 MG tablet Take 1 tablet (25 mg total) by mouth every 6 (six) hours as needed for anxiety. 12 tablet 0   meloxicam (MOBIC) 15 MG tablet Take 15 mg by mouth daily as needed for muscle spasms.  0   montelukast (SINGULAIR) 10 MG tablet Take 1 tablet (10 mg total) by mouth at bedtime. 90 tablet 1   Multiple Vitamin (MULTIVITAMIN) tablet Take 1 tablet by mouth daily.     sertraline (ZOLOFT) 25 MG tablet Take 25 mg by mouth daily.  0   SUMAtriptan (IMITREX) 20 MG/ACT nasal spray 1 spray into nostril.  May repeat in 2 hours if headache persists or recurs.  Maxium 2 sprays in 24 hours 6 each 5   topiramate (TOPAMAX) 25 MG tablet  TAKE 1 TABLET(25 MG) BY MOUTH AT BEDTIME 90 tablet 0   traZODone (DESYREL) 50 MG tablet Take 50 mg by mouth at bedtime.     valsartan-hydrochlorothiazide (DIOVAN-HCT) 160-25 MG tablet Take 1 tablet by mouth daily.     potassium chloride SA (K-DUR,KLOR-CON) 20 MEQ tablet Take 2 tablets (40 mEq total) by mouth daily for 5 days. 10 tablet 0   No current facility-administered medications for this visit.   Known Allergies:  Allergies  Allergen  Reactions   Latex Itching   Penicillins Itching   Past Surgical History: Past Surgical History:  Procedure Laterality Date   ABDOMINAL HYSTERECTOMY     c-section     LAPAROSCOPIC GASTRIC SLEEVE RESECTION     TUBAL LIGATION     Family History: Family History  Problem Relation Age of Onset   Asthma Mother    COPD Mother    Social History: Kaylee Smith lives single-family home is 53 years old.  Water damage in the house.  With throughout.  Gas heating, central cooling.  Dog with access to bedroom.  Roaches in the house and bed is not 2 feet off the floor.  Dust mite precautions on bed and pillows.  Currently retired.  Not exposed to fumes, chemicals or dust.  HEPA filter in the home.  Tobacco exposure inside the car, she does not smoke.   ROS:  All other systems negative except as noted per HPI.  Objective:  Blood pressure 132/88, pulse 76, temperature 98.1 F (36.7 C), temperature source Temporal, resp. rate 20, height 5\' 4"  (1.626 m), weight 274 lb 12.8 oz (124.6 kg), last menstrual period 11/17/2010, SpO2 96%. Body mass index is 47.17 kg/m. Physical Exam:  General Appearance:  Alert, cooperative, no distress, appears stated age, ambulates with walker   Head:  Normocephalic, without obvious abnormality, atraumatic  Eyes:  Conjunctiva clear, EOM's intact  Ears EACs normal bilaterally  Nose: Nares normal,  edematous nasal mucosa, hypertrophic turbinates, no visible anterior polyps, and septum midline  Throat: Lips, tongue normal; teeth and gums normal, normal posterior oropharynx  Neck: Supple, symmetrical  Lungs:   clear to auscultation bilaterally, Respirations unlabored, no coughing  Heart:  regular rate and rhythm and no murmur, Appears well perfused  Extremities: No edema  Skin: Skin color, texture, turgor normal and no rashes or lesions on visualized portions of skin  Neurologic: No gross deficits   Diagnostics: None done    Labs:  Lab Orders  No laboratory test(s) ordered  today     Assessment and Plan  Assessment and Plan    Chronic Rhinitis with migraines : - Return for allergy testing (1-55) Hold cetrizine/zyrtec for 3 days prior   - Prevention:  - allergen avoidance when possible - consider allergy shots as long term control of your symptoms by teaching your immune system to be more tolerant of your allergy triggers  - Symptom control: - Continue Nasal Steroid Spray: Best results if used daily. - Options include Flonase (fluticasone), Nasocort (triamcinolone), Nasonex (mometasome) 1- 2 sprays in each nostril daily.  - All can be purchased over-the-counter if not covered by insurance. - Start Astelin (azelastine) 1-2 sprays in each nostril twice a day as needed for nasal congestion/itchy nose. - Start Singulair (Montelukast) 10mg  nightly.   - Discontinue if nightmares of behavior changes. - Continue Antihistamine: daily or daily as needed.   -Options include Zyrtec (Cetirizine) 10mg , Claritin (Loratadine) 10mg , Allegra (Fexofenadine) 180mg , or Xyzal (Levocetirinze) 5mg  - Can be purchased over-the-counter if  not covered by insurance.  Follow up: for allergy testing (1-55)   Migraines Chronic migraines managed with monthly injections and rizatriptan as needed. No clear association with sinus/allergy symptoms. -Continue current migraine management plan.  Gastroesophageal Reflux Disease (GERD) History of GERD, but no current symptoms reported after weight loss surgery. -No changes to current management plan.    This note in its entirety was forwarded to the Provider who requested this consultation.  Other:     Thank you for your kind referral. I appreciate the opportunity to take part in Kaylee Smith's care. Please do not hesitate to contact me with questions.  Sincerely,  Thank you so much for letting me partake in your care today.  Don't hesitate to reach out if you have any additional concerns!  Ferol Luz, MD  [facility] [place], [place]

## 2023-03-22 ENCOUNTER — Ambulatory Visit: Payer: 59 | Admitting: Internal Medicine

## 2023-04-04 ENCOUNTER — Ambulatory Visit (INDEPENDENT_AMBULATORY_CARE_PROVIDER_SITE_OTHER): Payer: 59 | Admitting: Internal Medicine

## 2023-04-04 DIAGNOSIS — K219 Gastro-esophageal reflux disease without esophagitis: Secondary | ICD-10-CM

## 2023-04-04 DIAGNOSIS — J3089 Other allergic rhinitis: Secondary | ICD-10-CM | POA: Diagnosis not present

## 2023-04-04 DIAGNOSIS — G43119 Migraine with aura, intractable, without status migrainosus: Secondary | ICD-10-CM

## 2023-04-04 DIAGNOSIS — I10 Essential (primary) hypertension: Secondary | ICD-10-CM

## 2023-04-04 NOTE — Patient Instructions (Addendum)
 Chronic Rhinitis  with migraines  : - Return for allergy testing ( 04/04/23): positive to mold   - Prevention:  - allergen avoidance when possible   - This includes removal of mold from living environment - consider allergy shots as long term control of your symptoms by teaching your immune system to be more tolerant of your allergy triggers  - Symptom control: - Continue Nasal Steroid Spray: Best results if used daily. - Options include Flonase (fluticasone), Nasocort (triamcinolone), Nasonex (mometasome) 1- 2 sprays in each nostril daily.  - All can be purchased over-the-counter if not covered by insurance. - Continue Astelin (azelastine) 1-2 sprays in each nostril twice a day as needed for nasal congestion/itchy nose. - Continue Singulair (Montelukast) 10mg  nightly.   - Discontinue if nightmares of behavior changes. - Continue Antihistamine: daily or daily as needed.   -Options include Zyrtec (Cetirizine) 10mg , Claritin (Loratadine) 10mg , Allegra (Fexofenadine) 180mg , or Xyzal (Levocetirinze) 5mg  - Can be purchased over-the-counter if not covered by insurance.  Migraines Chronic migraines managed with monthly injections and rizatriptan as needed. No clear association with sinus/allergy symptoms. -Continue current migraine management plan.  Gastroesophageal Reflux Disease (GERD) History of GERD, but no current symptoms reported after weight loss surgery. -No changes to current management plan.  Follow up:6 months   Thank you so much for letting me partake in your care today.  Don't hesitate to reach out if you have any additional concerns!  Ferol Luz, MD  [facility] [place], [place]  Control of Mold Allergen   Mold and fungi can grow on a variety of surfaces provided certain temperature and moisture conditions exist.  Outdoor molds grow on plants, decaying vegetation and soil.  The major outdoor mold, Alternaria and Cladosporium, are found in very high  numbers during hot and dry conditions.  Generally, a late Summer - Fall peak is seen for common outdoor fungal spores.  Rain will temporarily lower outdoor mold spore count, but counts rise rapidly when the rainy period ends.  The most important indoor molds are Aspergillus and Penicillium.  Dark, humid and poorly ventilated basements are ideal sites for mold growth.  The next most common sites of mold growth are the bathroom and the kitchen.  Outdoor (Seasonal) Mold Control  Use air conditioning and keep windows closed Avoid exposure to decaying vegetation. Avoid leaf raking. Avoid grain handling. Consider wearing a face mask if working in moldy areas.    Indoor (Perennial) Mold Control   Maintain humidity below 50%. Clean washable surfaces with 5% bleach solution. Remove sources e.g. contaminated carpets.

## 2023-04-04 NOTE — Progress Notes (Signed)
 Date of Service/Encounter:  04/04/23  Allergy testing appointment   Initial visit on 03/14/23, seen for allergic rhinitis .  Please see that note for additional details.  Today reports for allergy diagnostic testing:    DIAGNOSTICS:  Skin Testing: Environmental allergy panel. Adequate positive and negative controls Results discussed with patient/family.  Airborne Adult Perc - 04/04/23 1533     Time Antigen Placed 1533    Allergen Manufacturer Waynette Buttery    Location Back    Number of Test 55    1. Control-Buffer 50% Glycerol Negative    2. Control-Histamine 2+    3. Bahia Negative    4. French Southern Territories Negative    5. Johnson Negative    6. Kentucky Blue Negative    7. Meadow Fescue Negative    8. Perennial Rye Negative    9. Timothy Negative    10. Ragweed Mix Negative    11. Cocklebur Negative    12. Plantain,  English Negative    13. Baccharis Negative    14. Dog Fennel Negative    15. Russian Thistle Negative    16. Lamb's Quarters Negative    17. Sheep Sorrell Negative    18. Rough Pigweed Negative    19. Marsh Elder, Rough Negative    20. Mugwort, Common Negative    21. Box, Elder Negative    22. Cedar, red Negative    23. Sweet Gum Negative    24. Pecan Pollen Negative    25. Pine Mix Negative    26. Walnut, Black Pollen Negative    27. Red Mulberry Negative    28. Ash Mix Negative    29. Birch Mix Negative    30. Beech American Negative    31. Cottonwood, Guinea-Bissau Negative    32. Hickory, White Negative    33. Maple Mix Negative    34. Oak, Guinea-[place]iss[place] Mix Negative    35. Sycamore Eastern Negative    36. Alternaria Alternata Negative    37. Cladosporium Herbarum Negative    38. Aspergillus Mix Negative    39. Penicillium Mix Negative    40. Bipolaris Sorokiniana (Helminthosporium) Negative    41. Drechslera Spicifera (Curvularia) Negative    42. Mucor Plumbeus Negative    43. Fusarium Moniliforme Negative    44. Aureobasidium Pullulans (pullulara) Negative    45.  Rhizopus Oryzae Negative    46. Botrytis Cinera Negative    47. Epicoccum Nigrum Negative    48. Phoma Betae Negative    49. Dust Mite Mix Negative    50. Cat Hair 10,000 BAU/ml Negative    51.  Dog Epithelia Negative    52. Mixed Feathers Negative    53. Horse Epithelia Negative    54. Cockroach, German Negative    55. Tobacco Leaf Negative             Intradermal - 04/04/23 1607     Time Antigen Placed 1407    Allergen Manufacturer Waynette Buttery    Location Arm    Number of Test 16    Control Negative    Bahia Negative    French Southern Territories Negative    Johnson Negative    7 Grass Negative    Ragweed Mix Negative    Weed Mix Negative    Tree Mix Negative    Mold 1 2+    Mold 2 2+    Mold 3 Negative    Mold 4 Negative    Mite Mix Negative    Cat Negative  Dog Negative    Cockroach Negative             Allergy testing results were read and interpreted by myself, documented by clinical staff.  Patient provided with copy of allergy testing along with avoidance measures when indicated.   Ferol Luz, MD  [facility] [place]

## 2023-05-15 ENCOUNTER — Telehealth: Payer: Self-pay | Admitting: Internal Medicine

## 2023-05-15 NOTE — Telephone Encounter (Signed)
 I called the patient and she hung up on me.

## 2023-05-15 NOTE — Telephone Encounter (Signed)
 Pt is requesting meds for congestion, pt states she has a bad taste in her mouth from congestion.

## 2023-05-18 ENCOUNTER — Ambulatory Visit: Admitting: Internal Medicine

## 2023-05-23 NOTE — Telephone Encounter (Signed)
 Tried calling no answer and unable to leave voicemail. Mailbox full.

## 2023-10-11 ENCOUNTER — Ambulatory Visit (INDEPENDENT_AMBULATORY_CARE_PROVIDER_SITE_OTHER): Admitting: Internal Medicine

## 2023-10-11 ENCOUNTER — Encounter: Payer: Self-pay | Admitting: Internal Medicine

## 2023-10-11 ENCOUNTER — Telehealth: Payer: Self-pay | Admitting: *Deleted

## 2023-10-11 VITALS — BP 108/64 | HR 78 | Resp 18

## 2023-10-11 DIAGNOSIS — J328 Other chronic sinusitis: Secondary | ICD-10-CM | POA: Diagnosis not present

## 2023-10-11 DIAGNOSIS — J3089 Other allergic rhinitis: Secondary | ICD-10-CM

## 2023-10-11 DIAGNOSIS — K219 Gastro-esophageal reflux disease without esophagitis: Secondary | ICD-10-CM | POA: Diagnosis not present

## 2023-10-11 DIAGNOSIS — G43119 Migraine with aura, intractable, without status migrainosus: Secondary | ICD-10-CM

## 2023-10-11 NOTE — Telephone Encounter (Signed)
 Pt will need a sinus CT without contrast. Dx- chronic sinusitis. Need to check if prior shara is needed.

## 2023-10-11 NOTE — Patient Instructions (Addendum)
 Chronic Rhinitis with migraines : rhinitis improved, migraines/headaches persist  -  allergy  testing (04/04/23): positive to mold  - Will get CT scan of your sinuses to see if your current symptoms are from your headaches or sinuses   - Prevention:  - allergen avoidance when possible   - This includes removal of mold from living environment - consider allergy  shots as long term control of your symptoms by teaching your immune system to be more tolerant of your allergy  triggers  - Symptom control: - Continue Flonase  1 spray per nostril twice daily. Aim upward and outward  - All can be purchased over-the-counter if not covered by insurance. - Continue Astelin  (azelastine ) 1-2 sprays in each nostril twice a day as needed for nasal congestion/itchy nose. - Continue Antihistamine: daily or daily as needed.   -Options include Zyrtec (Cetirizine) 10mg , Claritin (Loratadine) 10mg , Allegra (Fexofenadine) 180mg , or Xyzal (Levocetirinze) 5mg  - Can be purchased over-the-counter if not covered by insurance.  Migraines Chronic migraines managed with monthly injections and rizatriptan as needed. No clear association with sinus/allergy  symptoms. -Continue current migraine management plan.  Gastroesophageal Reflux Disease (GERD) History of GERD, but no current symptoms reported after weight loss surgery. -No changes to current management plan.  Follow up: We will call you after your CT scan and next steps   Thank you so much for letting me partake in your care today.  Don't hesitate to reach out if you have any additional concerns!  Hargis Springer, MD  [facility] [place], [place]

## 2023-10-11 NOTE — Progress Notes (Signed)
 FOLLOW UP Date of Service/Encounter:  10/11/23  Subjective:  Kaylee Smith (DOB: 07/27/1970) is a 53 y.o. female who [facility] in re-evaluation of the following: migraines, rhinitis, GERD  History obtained from: chart review and patient.  For Review, LV was on 04/04/23  with Dr. Lorin seen for skin testing . See below for summary of history and diagnostics.   ----------------------------------------------------- Pertinent History/Diagnostics:  Allergic Rhinitis:  Rx: flonase , astelin , singulair , zyrtec  - SPT environmental panel (04/04/23): positive to mold  Other: Migraines(Chronic migraines managed with monthly injections and rizatriptan as needed), GERD (improved with weight loss surgery)  --------------------------------------------------- Today presents for follow-up. Discussed the use of AI scribe software for clinical note transcription with the patient, who gave verbal consent to proceed.  History of Present Illness Kaylee Smith is a 53 year old female who presents with persistent headaches and migraines for evaluation of potential sinus involvement.  Headache and migraine symptoms - Persistent headaches and migraines for the past six months without change in symptoms - Headache pain originates in the sinus area and extends upwards - No significant improvement in headache symptoms despite treatment by a migraine specialist with both oral medication and injections - No improvement in headache symptoms with current allergy  treatments  Allergic rhinitis and sinus symptoms - Allergy  testing positive for mold allergy  - Daily use of two nasal sprays (Flonase  and Astelin ) and an oral antihistamine (Zyrtec) - Allergy  medications have improved allergy  symptoms but not headaches - Ability to 'pop' her ears - Uncertain about recent sinus imaging; recalls prior brain imaging study a few years ago      All medications reviewed by  clinical staff and updated in chart. No new pertinent medical or surgical history except as noted in HPI.  ROS: All others negative except as noted per HPI.   Objective:  BP 108/64   Pulse 78   Resp 18   LMP 11/17/2010   SpO2 94%  There is no height or weight on file to calculate BMI. Physical Exam: General Appearance:  Alert, cooperative, no distress, appears stated age, in wheelchair   Head:  Normocephalic, without obvious abnormality, atraumatic  Eyes:  Conjunctiva clear, EOM's intact  Ears EACs normal bilaterally and normal TMs bilaterally  Nose: Nares normal, normal mucosa, no visible anterior polyps, and septum midline, sinus tenderness   Throat: Lips, tongue normal; teeth and gums normal, normal posterior oropharynx  Neck: Supple, symmetrical  Lungs:   clear to auscultation bilaterally, Respirations unlabored, no coughing  Heart:  regular rate and rhythm and no murmur, Appears well perfused  Extremities: No edema  Skin: Skin color, texture, turgor normal and no rashes or lesions on visualized portions of skin  Neurologic: No gross deficits   Labs:  Lab Orders  No laboratory test(s) ordered today     Assessment/Plan   Patient Instructions  Chronic Rhinitis with migraines : rhinitis improved, migraines/headaches persist  -  allergy  testing (04/04/23): positive to mold  - Will get CT scan of your sinuses to see if your current symptoms are from your headaches or sinuses   - Prevention:  - allergen avoidance when possible   - This includes removal of mold from living environment - consider allergy  shots as long term control of your symptoms by teaching your immune system to be more tolerant of your allergy  triggers  - Symptom control: - Continue Flonase  1 spray per nostril twice daily. Aim upward and outward  -  All can be purchased over-the-counter if not covered by insurance. - Continue Astelin  (azelastine ) 1-2 sprays in each nostril twice a day as needed for nasal  congestion/itchy nose. - Continue Antihistamine: daily or daily as needed.   -Options include Zyrtec (Cetirizine) 10mg , Claritin (Loratadine) 10mg , Allegra (Fexofenadine) 180mg , or Xyzal (Levocetirinze) 5mg  - Can be purchased over-the-counter if not covered by insurance.  Migraines Chronic migraines managed with monthly injections and rizatriptan as needed. No clear association with sinus/allergy  symptoms. -Continue current migraine management plan.  Gastroesophageal Reflux Disease (GERD) History of GERD, but no current symptoms reported after weight loss surgery. -No changes to current management plan.  Follow up: We will call you after your CT scan and next steps   Thank you so much for letting me partake in your care today.  Don't hesitate to reach out if you have any additional concerns!  Hargis Springer, MD  [facility] [place], [place]     Other:    Thank you so much for letting me partake in your care today.  Don't hesitate to reach out if you have any additional concerns!  Hargis Springer, MD  [facility] [place], [place]

## 2023-10-12 NOTE — Telephone Encounter (Signed)
 Called [facility] to schedule CT for patient and spoke with Harlene and she will contact patient to schedule this appointment and aware no prior authorization needed for this procedure code and dx code as well.

## 2023-10-12 NOTE — Telephone Encounter (Signed)
 Contacted patient and informed of information as far as prior authorization and states she is waiting on call to get scheduled for  CT Sinus without contrast.

## 2023-10-12 NOTE — Telephone Encounter (Signed)
 Called [facility] Provider line for prior approval for procedure code 29513 ct sinus without contrast and Dx code J32.8 and informed per Valery AI that no prior authorization was needed for these codes at all places and give last 4 digits of the reference call [phone]

## 2023-10-18 ENCOUNTER — Ambulatory Visit (HOSPITAL_BASED_OUTPATIENT_CLINIC_OR_DEPARTMENT_OTHER)

## 2023-10-25 ENCOUNTER — Ambulatory Visit: Admitting: Dermatology

## 2023-10-25 ENCOUNTER — Encounter: Payer: Self-pay | Admitting: Dermatology

## 2023-10-25 VITALS — BP 135/95

## 2023-10-25 DIAGNOSIS — L658 Other specified nonscarring hair loss: Secondary | ICD-10-CM

## 2023-10-25 DIAGNOSIS — L905 Scar conditions and fibrosis of skin: Secondary | ICD-10-CM | POA: Diagnosis not present

## 2023-10-25 MED ORDER — SAFETY SEAL MISCELLANEOUS MISC: 5 refills | Status: AC

## 2023-10-25 NOTE — Progress Notes (Signed)
   New Patient Visit   Subjective  Kaylee Smith is a 53 y.o. female who presents for a NEW PATIENT appointment to be examined for the concerns as listed below.  Hair Loss: Pt first noticed hair changes 1-2 years ago. She staed the crown is stable but the temple area, sides and nape are only grows hair about 3in and starts to break off at the scalp. Pt stated her scalp was itchy rating it 6 out of 10 so she shampoos with head and shoulders. She has never been on Rx Tx for this issue. Pt mentioned that her mother also suffers from hair loss in the same areas.    Are you nursing, pregnant or trying to conceive? no    No Hx of Bx. No family Hx of skin cancer.   The following portions of the chart were reviewed this encounter and updated as appropriate: medications, allergies, medical history  Review of Systems:  No other skin or systemic complaints except as noted in HPI or Assessment and Plan.  Objective  Well appearing patient in no apparent distress; mood and affect are within normal limits.   A focused examination was performed of the following areas: scalp   Relevant exam findings are noted in the Assessment and Plan.             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 Inflammation and scarring of hair follicles are present, complicating hair regrowth. Treatment aims to reduce inflammation and stimulate hair growth, with most patients responding well, though full results may take 12 to 18 months.  - Prescribe compounded topical medication containing clobetasol and minoxidil for daily morning application using a cotton ball to prevent dripping. - Recommend Vital Proteins collagen supplement to support hair growth and strength. - Advise monitoring hair growth every four weeks, with expected noticeable improvement in  six months. - Schedule follow-up appointment in six months to assess progress and take new photographs. - Compounded medication will be prepared and mailed by a trusted pharmacy.   No follow-ups on file.   Documentation: I have reviewed the above documentation for accuracy and completeness, and I agree with the above.  I, Shirron Maranda, CMA, am acting as scribe for [employer], DO.   Delon Lenis, DO

## 2023-10-25 NOTE — Patient Instructions (Addendum)
 VISIT SUMMARY:  During your visit, we discussed your concerns about hair thinning and breakage, particularly around the edges of your scalp. We reviewed your history of hair styling practices and family history of hair thinning, which may be contributing to your current condition. We have developed a treatment plan to address these issues and support hair regrowth.  YOUR PLAN:  -TRACTION ALOPECIA WITH SCARRING: Traction alopecia with scarring is a condition where hair thinning occurs due to repeated tension from hair styling practices, leading to inflammation and scarring of hair follicles. To reduce inflammation and stimulate hair growth, you will use a compounded topical medication containing clobetasol and minoxidil daily in the morning. Additionally, you are advised to take Vital Proteins collagen supplement to support hair growth and strength. We will monitor your hair growth every four weeks, with noticeable improvement expected in six months. A follow-up appointment is scheduled in six months to assess your progress and take new photographs. The compounded medication will be prepared and mailed by a trusted pharmacy.  INSTRUCTIONS:  Please apply the compounded topical medication containing clobetasol and minoxidil daily in the morning using a cotton ball to prevent dripping. Take Vital Proteins collagen supplement as recommended. Monitor your hair growth every four weeks and expect noticeable improvement in six months. Schedule a follow-up appointment in six months to assess progress and take new photographs. The compounded medication will be prepared and mailed by a trusted pharmacy.      Important Information  Due to recent changes in healthcare laws, you may see results of your pathology and/or laboratory studies on MyChart before the doctors have had a chance to review them. We understand that in some cases there may be results that are confusing or concerning to you. Please understand  that not all results are received at the same time and often the doctors may need to interpret multiple results in order to provide you with the best plan of care or course of treatment. Therefore, we ask that you please give us  2 business days to thoroughly review all your results before contacting the office for clarification. Should we see a critical lab result, you will be contacted sooner.   If You Need Anything After Your Visit  If you have any questions or concerns for your doctor, please call our main line at [phone] If no one answers, please leave a voicemail as directed and we will return your call as soon as possible. Messages left after 4 pm will be answered the following business day.   You may also send us  a message via MyChart. We typically respond to MyChart messages within 1-2 business days.  For prescription refills, please ask your pharmacy to contact our office. Our fax number is [phone].  If you have an urgent issue when the clinic is closed that cannot wait until the next business day, you can page your doctor at the number below.    Please note that while we do our best to be available for urgent issues outside of office hours, we are not available 24/7.   If you have an urgent issue and are unable to reach us , you may choose to seek medical care at your doctor's office, retail clinic, urgent care center, or emergency room.  If you have a medical emergency, please immediately call 911 or go to the emergency department. In the event of inclement weather, please call our main line at [phone] for an update on the status of any delays or closures.  Dermatology Medication Tips: Please keep the boxes that topical medications come in in order to help keep track of the instructions about where and how to use these. Pharmacies typically print the medication instructions only on the boxes and not directly on the medication tubes.   If your medication is too  expensive, please contact our office at [phone] or send us  a message through MyChart.   We are unable to tell what your co-pay for medications will be in advance as this is different depending on your insurance coverage. However, we may be able to find a substitute medication at lower cost or fill out paperwork to get insurance to cover a needed medication.   If a prior authorization is required to get your medication covered by your insurance company, please allow us  1-2 business days to complete this process.  Drug prices often vary depending on where the prescription is filled and some pharmacies may offer cheaper prices.  The website www.goodrx.com contains coupons for medications through different pharmacies. The prices here do not account for what the cost may be with help from insurance (it may be cheaper with your insurance), but the website can give you the price if you did not use any insurance.  - You can print the associated coupon and take it with your prescription to the pharmacy.  - You may also stop by our office during regular business hours and pick up a GoodRx coupon card.  - If you need your prescription sent electronically to a different pharmacy, notify our office through [facility] or by phone at [phone]

## 2024-04-24 ENCOUNTER — Ambulatory Visit: Admitting: Dermatology
